# Patient Record
Sex: Male | Born: 1960 | Race: Black or African American | Hispanic: No | Marital: Married | Smoking: Never smoker
Health system: Southern US, Community
[De-identification: ages and names within clinical notes are randomized; demographics above are authoritative.]

## PROBLEM LIST (undated history)

## (undated) DIAGNOSIS — M1990 Unspecified osteoarthritis, unspecified site: Secondary | ICD-10-CM

## (undated) DIAGNOSIS — G4730 Sleep apnea, unspecified: Secondary | ICD-10-CM

## (undated) DIAGNOSIS — R079 Chest pain, unspecified: Principal | ICD-10-CM

## (undated) DIAGNOSIS — I10 Essential (primary) hypertension: Secondary | ICD-10-CM

## (undated) HISTORY — PX: ROTATOR CUFF REPAIR: SHX139

## (undated) HISTORY — DX: Unspecified osteoarthritis, unspecified site: M19.90

## (undated) HISTORY — DX: Chest pain, unspecified: R07.9

## (undated) HISTORY — DX: Sleep apnea, unspecified: G47.30

## (undated) HISTORY — DX: Essential (primary) hypertension: I10

---

## 2002-03-28 ENCOUNTER — Emergency Department (HOSPITAL_COMMUNITY): Admission: EM | Admit: 2002-03-28 | Discharge: 2002-03-28 | Payer: Self-pay | Admitting: Emergency Medicine

## 2009-04-06 ENCOUNTER — Emergency Department (HOSPITAL_COMMUNITY): Admission: EM | Admit: 2009-04-06 | Discharge: 2009-04-06 | Payer: Self-pay | Admitting: Emergency Medicine

## 2009-04-11 ENCOUNTER — Ambulatory Visit (HOSPITAL_COMMUNITY): Admission: RE | Admit: 2009-04-11 | Discharge: 2009-04-11 | Payer: Self-pay | Admitting: Chiropractic Medicine

## 2009-05-02 ENCOUNTER — Ambulatory Visit (HOSPITAL_COMMUNITY): Admission: RE | Admit: 2009-05-02 | Discharge: 2009-05-02 | Payer: Self-pay | Admitting: Chiropractic Medicine

## 2009-10-25 ENCOUNTER — Emergency Department (HOSPITAL_COMMUNITY): Admission: EM | Admit: 2009-10-25 | Discharge: 2009-10-26 | Payer: Self-pay | Admitting: Emergency Medicine

## 2011-03-12 ENCOUNTER — Ambulatory Visit: Payer: Self-pay | Admitting: Internal Medicine

## 2011-03-18 LAB — URINALYSIS, ROUTINE W REFLEX MICROSCOPIC
Nitrite: NEGATIVE
Specific Gravity, Urine: 1.014 (ref 1.005–1.030)
pH: 5.5 (ref 5.0–8.0)

## 2011-03-18 LAB — COMPREHENSIVE METABOLIC PANEL
ALT: 37 U/L (ref 0–53)
Alkaline Phosphatase: 83 U/L (ref 39–117)
Chloride: 104 mEq/L (ref 96–112)
Glucose, Bld: 101 mg/dL — ABNORMAL HIGH (ref 70–99)
Potassium: 4 mEq/L (ref 3.5–5.1)
Sodium: 138 mEq/L (ref 135–145)
Total Protein: 6.9 g/dL (ref 6.0–8.3)

## 2011-03-18 LAB — DIFFERENTIAL
Basophils Relative: 1 % (ref 0–1)
Eosinophils Absolute: 0.2 10*3/uL (ref 0.0–0.7)
Monocytes Absolute: 0.5 10*3/uL (ref 0.1–1.0)
Monocytes Relative: 7 % (ref 3–12)
Neutrophils Relative %: 70 % (ref 43–77)

## 2011-03-18 LAB — CBC
Hemoglobin: 14.7 g/dL (ref 13.0–17.0)
RBC: 4.76 MIL/uL (ref 4.22–5.81)
RDW: 11.4 % — ABNORMAL LOW (ref 11.5–15.5)
WBC: 7.5 10*3/uL (ref 4.0–10.5)

## 2011-04-03 ENCOUNTER — Encounter: Payer: Self-pay | Admitting: Internal Medicine

## 2011-04-03 ENCOUNTER — Ambulatory Visit (INDEPENDENT_AMBULATORY_CARE_PROVIDER_SITE_OTHER): Payer: BC Managed Care – PPO | Admitting: Internal Medicine

## 2011-04-03 VITALS — BP 128/88 | HR 77 | Ht 72.0 in | Wt 170.0 lb

## 2011-04-03 DIAGNOSIS — Z23 Encounter for immunization: Secondary | ICD-10-CM

## 2011-04-03 DIAGNOSIS — Z0000 Encounter for general adult medical examination without abnormal findings: Secondary | ICD-10-CM

## 2011-04-03 LAB — POCT URINALYSIS DIPSTICK
Glucose, UA: NEGATIVE
Nitrite, UA: NEGATIVE
Urobilinogen, UA: 0.2

## 2011-04-03 LAB — LIPID PANEL
Cholesterol: 164 mg/dL (ref 0–200)
HDL: 49.5 mg/dL (ref >39.00)
VLDL: 23.6 mg/dL (ref 0.0–40.0)

## 2011-04-03 LAB — BASIC METABOLIC PANEL
GFR: 81.85 mL/min (ref >60.00)
Potassium: 4.3 mEq/L (ref 3.5–5.1)
Sodium: 139 mEq/L (ref 135–145)

## 2011-04-03 LAB — CBC WITH DIFFERENTIAL/PLATELET
Basophils Absolute: 0 10*3/uL (ref 0.0–0.1)
Eosinophils Absolute: 0.1 10*3/uL (ref 0.0–0.7)
Lymphocytes Relative: 19.4 % (ref 12.0–46.0)
MCHC: 34.5 g/dL (ref 30.0–36.0)
Neutrophils Relative %: 70.3 % (ref 43.0–77.0)
RBC: 5.21 Mil/uL (ref 4.22–5.81)
RDW: 12.2 % (ref 11.5–14.6)

## 2011-04-03 LAB — HEPATIC FUNCTION PANEL
ALT: 48 U/L (ref 0–53)
AST: 41 U/L — ABNORMAL HIGH (ref 0–37)
Bilirubin, Direct: 0.1 mg/dL (ref 0.0–0.3)
Total Protein: 7.5 g/dL (ref 6.0–8.3)

## 2011-04-03 LAB — PSA: PSA: 1.19 ng/mL (ref 0.10–4.00)

## 2011-04-03 NOTE — Progress Notes (Signed)
  Subjective:    Patient ID: Francisco Carter, male    DOB: 21-Apr-1961, 50 y.o.   MRN: [id number]  HPI Patient presents to clinic to establish primary care and for complete physical exam. Notes mild intermittent left leg pain upper and lower without associated back pain, paresthesias, weakness, injury or trauma. 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 Has been told in the past his blood pressure was mildly elevated but has no formal history of hypertension and takes no medication for this. No active complaint.  Reviewed past medical history, past surgical history, medications, allergies,social history, and family history    Review of Systems  Musculoskeletal: Negative for back pain, arthralgias and gait problem.       [Intermittent left leg pain Neurological: Negative for weakness and numbness.  [all other systems reviewed and are negative       Objective:   Physical Exam    Physical Exam  Vitals reviewed. Constitutional:  appears well-developed and well-nourished. No distress.  HENT:  Head: Normocephalic and atraumatic.  Right Ear: Tympanic membrane, external ear and ear canal normal.  Left Ear: Tympanic membrane, external ear and ear canal normal.  Nose: Nose normal.  Mouth/Throat: Oropharynx is clear and moist. No oropharyngeal exudate.  Eyes: Conjunctivae and EOM are normal. Pupils are equal, round, and reactive to light. Right eye exhibits no discharge. Left eye exhibits no discharge. No scleral icterus.  Neck: Neck supple. No thyromegaly present.No carotid bruits Cardiovascular: Normal rate, regular rhythm and normal heart sounds.  Exam reveals no gallop and no friction rub.   No murmur heard. Pulmonary/Chest: Effort normal and breath sounds normal. No respiratory distress.  has no wheezes.  has no rales.  Abdomen; Soft nondistended nontender positive bowel sounds. No masses appreciated.No  organomegaly Extremities: No edema.Acyanotic. Lymphadenopathy:   no cervical adenopathy.  Neurological:  is alert.  Skin: Skin is warm and dry.  not diaphoretic.  Rectal:G ood sphincter tone. Minimal stool in vault heme-negative Prostate soft nontender without nodularity [id number] descended testes without nodularity or tenderness. No hernia on Valsalva. Psychiatric: normal mood and affect.      Assessment & Plan:

## 2011-04-04 DIAGNOSIS — Z0000 Encounter for general adult medical examination without abnormal findings: Secondary | ICD-10-CM | POA: Insufficient documentation

## 2011-04-04 NOTE — Assessment & Plan Note (Signed)
Discussed history of mild elevated blood pressure and recommended low sodium diet and regular aerobic exercise. Monitor blood pressure as an outpatient and followup in clinic as scheduled. Obtain CBC, Chem-7, LFT, fasting lipid profile, PSA and urinalysis. EKG obtained in straight normal sinus rhythm, normal intervals and axis without evidence of ischemia. Recommend screening colonoscopy at age 50.

## 2011-04-10 ENCOUNTER — Telehealth: Payer: Self-pay

## 2011-04-10 NOTE — Telephone Encounter (Signed)
Message copied by Kyung Rudd on Fri Apr 10, 2011  2:00 PM ------      Message from: Letitia Libra, [place]      Created: Fri Apr 10, 2011 12:41 PM       Labs nl except minimally elevated liver test. Suggest avoiding tylenol and etoh for a few weeks. Drink enough water. Recheck LFT in 3-4 wks. Dx=abn LFT

## 2011-04-10 NOTE — Telephone Encounter (Signed)
Pt aware. Pt to call for lab appt in 3-4 weeks

## 2011-05-08 ENCOUNTER — Other Ambulatory Visit (INDEPENDENT_AMBULATORY_CARE_PROVIDER_SITE_OTHER): Payer: BC Managed Care – PPO

## 2011-05-08 DIAGNOSIS — T887XXA Unspecified adverse effect of drug or medicament, initial encounter: Secondary | ICD-10-CM

## 2011-05-08 LAB — HEPATIC FUNCTION PANEL
ALT: 36 U/L (ref 0–53)
Albumin: 3.9 g/dL (ref 3.5–5.2)
Total Bilirubin: 0.8 mg/dL (ref 0.3–1.2)

## 2011-05-13 ENCOUNTER — Telehealth: Payer: Self-pay

## 2011-05-13 NOTE — Telephone Encounter (Signed)
Left message to notify pt test nl

## 2011-05-13 NOTE — Telephone Encounter (Signed)
Message copied by Beverely Low on Wed May 13, 2011  2:43 PM ------      Message from: Staci Righter      Created: Tue May 12, 2011 10:21 PM       Repeat liver tests nl

## 2011-10-02 ENCOUNTER — Encounter: Payer: Self-pay | Admitting: Internal Medicine

## 2011-10-02 ENCOUNTER — Ambulatory Visit (INDEPENDENT_AMBULATORY_CARE_PROVIDER_SITE_OTHER): Payer: BC Managed Care – PPO | Admitting: Internal Medicine

## 2011-10-02 ENCOUNTER — Ambulatory Visit: Payer: BC Managed Care – PPO | Admitting: Internal Medicine

## 2011-10-02 VITALS — BP 132/90 | HR 72 | Temp 97.8°F | Resp 18

## 2011-10-02 DIAGNOSIS — Z23 Encounter for immunization: Secondary | ICD-10-CM

## 2011-10-02 DIAGNOSIS — I10 Essential (primary) hypertension: Secondary | ICD-10-CM

## 2011-10-02 DIAGNOSIS — G5600 Carpal tunnel syndrome, unspecified upper limb: Secondary | ICD-10-CM

## 2011-10-02 NOTE — Assessment & Plan Note (Signed)
Mild and occurs infrequently. If becomes more frequent recommend wrist splint. Followup if no improvement or worsening.

## 2011-10-02 NOTE — Assessment & Plan Note (Signed)
New formal dx. Mild and asx. Recommend low sodium diet and regular exercise. Monitor bp as outpt and follow up in clinic as scheduled.

## 2011-10-02 NOTE — Progress Notes (Signed)
  Subjective:    Patient ID: Francisco Carter, male    DOB: 13-Oct-1961, 50 y.o.   MRN: [id number]  HPI Pt presents to clinic for follow up of elevated blood pressure. Home monitoring has demonstrated continued mild elevations with several diastolic values 90+. Asx without headaches, dizziness, chest pain or dyspnea. Not taking any medication for the problem. Also notes intermittent R>L hand pain and numbness. Occurs ~ 1 time a month. No injury or trauma. Works as a Naval architect. No other alleviating or exacerbating factors. No other complaints.  No past medical history on file. Past Surgical History  Procedure Date  . Rotator cuff repair     reports that he has never smoked. He has never used smokeless tobacco. He reports that he drinks alcohol. He reports that he does not use illicit drugs. family history includes Cancer in his mother; Heart disease in his father; and Hypertension in his father and mother. No Known Allergies     Review of Systems see hpi     Objective:   Physical Exam  Nursing note and vitals reviewed. Constitutional: He appears well-developed and well-nourished. No distress.  HENT:  Head: Normocephalic and atraumatic.  Right Ear: External ear normal.  Left Ear: External ear normal.  Eyes: Conjunctivae are normal. No scleral icterus.  Cardiovascular: Normal rate, regular rhythm and normal heart sounds.  Exam reveals no gallop and no friction rub.   No murmur heard. Pulmonary/Chest: Effort normal and breath sounds normal. No respiratory distress. He has no wheezes. He has no rales.  Musculoskeletal:       phalen's right neg. No thenar muscle wasting  Neurological: He is alert.  Skin: Skin is warm and dry. He is not diaphoretic.  Psychiatric: He has a normal mood and affect.          Assessment & Plan:

## 2012-01-01 ENCOUNTER — Ambulatory Visit (INDEPENDENT_AMBULATORY_CARE_PROVIDER_SITE_OTHER): Payer: BC Managed Care – PPO | Admitting: Internal Medicine

## 2012-01-01 ENCOUNTER — Ambulatory Visit (HOSPITAL_BASED_OUTPATIENT_CLINIC_OR_DEPARTMENT_OTHER)
Admission: RE | Admit: 2012-01-01 | Discharge: 2012-01-01 | Disposition: A | Discharge status: Home / Self Care | Payer: BC Managed Care – PPO | Source: Ambulatory Visit | Attending: Internal Medicine | Admitting: Internal Medicine

## 2012-01-01 ENCOUNTER — Encounter: Payer: Self-pay | Admitting: Internal Medicine

## 2012-01-01 DIAGNOSIS — M5090 Cervical disc disorder, unspecified, unspecified cervical region: Secondary | ICD-10-CM | POA: Insufficient documentation

## 2012-01-01 DIAGNOSIS — M25519 Pain in unspecified shoulder: Secondary | ICD-10-CM | POA: Insufficient documentation

## 2012-01-01 DIAGNOSIS — I10 Essential (primary) hypertension: Secondary | ICD-10-CM

## 2012-01-01 DIAGNOSIS — M542 Cervicalgia: Secondary | ICD-10-CM

## 2012-01-01 DIAGNOSIS — Z1211 Encounter for screening for malignant neoplasm of colon: Secondary | ICD-10-CM

## 2012-01-01 DIAGNOSIS — Q761 Klippel-Feil syndrome: Secondary | ICD-10-CM

## 2012-01-01 MED ORDER — METHYLPREDNISOLONE 4 MG PO KIT: PACK | ORAL | Status: DC

## 2012-01-01 NOTE — Progress Notes (Signed)
  Subjective:    Patient ID: Francisco Carter, male    DOB: Apr 13, 1961, 51 y.o.   MRN: [id number]  HPI Pt presents to clinic for followup of multiple medical problems. Recent dx of mild HTN. Home bp log reviewed with values of 118/83, 126/84 and 119/90. Taking no medication for the problem. Notes right arm radiating pain from neck to hand intermittently. No weakness or injury. Does have intermittent R>L hand numbness. No other alleviating or exacerbating factors. Now is 51y old and has not undergone colonoscopy previously. asx and no fam hx of colon cancer. No other complaints.   No past medical history on file. Past Surgical History  Procedure Date  . Rotator cuff repair     reports that he has never smoked. He has never used smokeless tobacco. He reports that he drinks alcohol. He reports that he does not use illicit drugs. family history includes Cancer in his mother; Heart disease in his father; and Hypertension in his father and mother. No Known Allergies   Review of Systems see hpi     Objective:   Physical Exam  Nursing note and vitals reviewed. Constitutional: He appears well-developed and well-nourished. No distress.  HENT:  Head: Normocephalic and atraumatic.  Right Ear: External ear normal.  Left Ear: External ear normal.  Eyes: Conjunctivae are normal. No scleral icterus.  Neck: Neck supple.  Cardiovascular: Normal rate, regular rhythm and normal heart sounds.  Exam reveals no gallop and no friction rub.   No murmur heard. Musculoskeletal:       Neck FROM. Left distal mcp/wrist 5/5.  Skin: Skin is warm and dry. He is not diaphoretic.  Psychiatric: He has a normal mood and affect.          Assessment & Plan:

## 2012-01-01 NOTE — Assessment & Plan Note (Signed)
Colonoscopy referral

## 2012-01-01 NOTE — Assessment & Plan Note (Signed)
Mild. Asx. Recommend attempt low sodium diet and regular aerobic exercise at least 4-5/wk. Continue home monitoring.

## 2012-01-01 NOTE — Assessment & Plan Note (Signed)
Radicular component. No weakness. Obtain cspine radiograph and attempt medrol dosepak. Followup if no improvement or worsening.

## 2012-01-04 ENCOUNTER — Telehealth: Payer: Self-pay | Admitting: *Deleted

## 2012-01-04 MED ORDER — METHYLPREDNISOLONE 4 MG PO KIT: PACK | ORAL | Status: AC

## 2012-01-04 NOTE — Telephone Encounter (Signed)
Patient called and left voice message regarding rx that was sent to pharmacy.  I spoke with Zella Ball at [facility] on [address], she stated [facility] and they do not have a Rx for him.  Call returned to patient at [phone], no answer. A voice message was left for patient to return call to clarify pharmacy.  Patient returned phone call; he has requested the Rx be sent to [facility] at [address]. Rx resent to pharmacy requested by patient.

## 2012-02-08 ENCOUNTER — Other Ambulatory Visit: Payer: BC Managed Care – PPO | Admitting: Internal Medicine

## 2012-02-19 ENCOUNTER — Telehealth: Payer: Self-pay | Admitting: *Deleted

## 2012-02-19 NOTE — Telephone Encounter (Signed)
RSC pt for 02/25/12.  Pt. Forgot his appt.

## 2012-02-25 ENCOUNTER — Ambulatory Visit (AMBULATORY_SURGERY_CENTER): Payer: BC Managed Care – PPO | Admitting: *Deleted

## 2012-02-25 ENCOUNTER — Encounter: Payer: Self-pay | Admitting: Gastroenterology

## 2012-02-25 VITALS — Ht 72.0 in | Wt 188.1 lb

## 2012-02-25 DIAGNOSIS — Z1211 Encounter for screening for malignant neoplasm of colon: Secondary | ICD-10-CM

## 2012-02-25 MED ORDER — PEG-KCL-NACL-NASULF-NA ASC-C 100 G PO SOLR: ORAL | Status: DC

## 2012-02-25 NOTE — Progress Notes (Signed)
No allergy to eggs or soy products 

## 2012-03-04 ENCOUNTER — Ambulatory Visit (AMBULATORY_SURGERY_CENTER): Payer: BC Managed Care – PPO | Admitting: Gastroenterology

## 2012-03-04 ENCOUNTER — Encounter: Payer: Self-pay | Admitting: Gastroenterology

## 2012-03-04 VITALS — BP 142/85 | HR 72 | Temp 97.7°F | Resp 20 | Ht 72.0 in | Wt 188.0 lb

## 2012-03-04 DIAGNOSIS — K648 Other hemorrhoids: Secondary | ICD-10-CM

## 2012-03-04 DIAGNOSIS — Z1211 Encounter for screening for malignant neoplasm of colon: Secondary | ICD-10-CM

## 2012-03-04 MED ORDER — SODIUM CHLORIDE 0.9 % IV SOLN: 500.0000 mL | INTRAVENOUS | Status: DC

## 2012-03-04 NOTE — Progress Notes (Signed)
Patient did not experience any of the following events: a burn prior to discharge; a fall within the facility; wrong site/side/patient/procedure/implant event; or a hospital transfer or hospital admission upon discharge from the facility. [phone]) Patient did not have preoperative order for IV antibiotic SSI prophylaxis. [phone])  Patient states he feel fine. Encourage when moving around today will expel air. Take mylanta or gas x later if needeed.

## 2012-03-04 NOTE — Progress Notes (Signed)
Propofol given per D Merritt CRNA 

## 2012-03-04 NOTE — Patient Instructions (Signed)
Discharge instructions given with verbal understanding. Handouts on diverticulosis and hemorrhoids given. Resume previous medications. YOU HAD AN ENDOSCOPIC PROCEDURE TODAY AT THE [facility]: Refer to the procedure report that was given to you for any specific questions about what was found during the examination.  If the procedure report does not answer your questions, please call your gastroenterologist to clarify.  If you requested that your care partner not be given the details of your procedure findings, then the procedure report has been included in a sealed envelope for you to review at your convenience later.  YOU SHOULD EXPECT: Some feelings of bloating in the abdomen. Passage of more gas than usual.  Walking can help get rid of the air that was put into your GI tract during the procedure and reduce the bloating. If you had a lower endoscopy (such as a colonoscopy or flexible sigmoidoscopy) you may notice spotting of blood in your stool or on the toilet paper. If you underwent a bowel prep for your procedure, then you may not have a normal bowel movement for a few days.  DIET: Your first meal following the procedure should be a light meal and then it is ok to progress to your normal diet.  A half-sandwich or bowl of soup is an example of a good first meal.  Heavy or fried foods are harder to digest and may make you feel nauseous or bloated.  Likewise meals heavy in dairy and vegetables can cause extra gas to form and this can also increase the bloating.  Drink plenty of fluids but you should avoid alcoholic beverages for 24 hours.  ACTIVITY: Your care partner should take you home directly after the procedure.  You should plan to take it easy, moving slowly for the rest of the day.  You can resume normal activity the day after the procedure however you should NOT DRIVE or use heavy machinery for 24 hours (because of the sedation medicines used during the test).    SYMPTOMS TO REPORT  IMMEDIATELY: A gastroenterologist can be reached at any hour.  During normal business hours, 8:30 AM to 5:00 PM Monday through Friday, call [phone].  After hours and on weekends, please call the GI answering service at [phone] who will take a message and have the physician on call contact you.   Following lower endoscopy (colonoscopy or flexible sigmoidoscopy):  Excessive amounts of blood in the stool  Significant tenderness or worsening of abdominal pains  Swelling of the abdomen that is new, acute  Fever of 100F or higher  FOLLOW UP: If any biopsies were taken you will be contacted by phone or by letter within the next 1-3 weeks.  Call your gastroenterologist if you have not heard about the biopsies in 3 weeks.  Our staff will call the home number listed on your records the next business day following your procedure to check on you and address any questions or concerns that you may have at that time regarding the information given to you following your procedure. This is a courtesy call and so if there is no answer at the home number and we have not heard from you through the emergency physician on call, we will assume that you have returned to your regular daily activities without incident.  SIGNATURES/CONFIDENTIALITY: You and/or your care partner have signed paperwork which will be entered into your electronic medical record.  These signatures attest to the fact that that the information above on your After Visit   Summary has been reviewed and is understood.  Full responsibility of the confidentiality of this discharge information lies with you and/or your care-partner. 

## 2012-03-04 NOTE — Op Note (Signed)
[facility] [address] [place], [place]  [address]  COLONOSCOPY PROCEDURE REPORT  PATIENT:  Francisco, Carter  MR#:  [id number] BIRTHDATE:  [birth year], 50 yrs. old  GENDER:  male ENDOSCOPIST:  Barbette Hair. Arlyce Dice, MD REF. BY:  Charlynn Court, M.D. PROCEDURE DATE:  03/04/2012 PROCEDURE:  Diagnostic Colonoscopy ASA CLASS:  Class I INDICATIONS:  Routine Risk Screening MEDICATIONS:   MAC sedation, administered by CRNA propofol 250mg IV  DESCRIPTION OF PROCEDURE:   After the risks benefits and alternatives of the procedure were thoroughly explained, informed consent was obtained.  Digital rectal exam was performed and revealed no abnormalities.   The [id number] endoscope was introduced through the anus and advanced to the cecum, which was identified by both the appendix and ileocecal valve, without limitations.  The quality of the prep was good, using MoviPrep. The instrument was then slowly withdrawn as the colon was fully examined. <<PROCEDUREIMAGES>>  FINDINGS:  Internal Hemorrhoids were found (see image4). Scattered diverticula were found (see image1). Sigmoid to transverse colon  Scattered diverticula were found (see image2). This was otherwise a normal examination of the colon (see image3). Retroflexed views in the rectum revealed no abnormalities.    The time to cecum =  1) 3.0  minutes. The scope was then withdrawn in 1) 9.75  minutes from the cecum and the procedure completed. COMPLICATIONS:  None ENDOSCOPIC IMPRESSION: 1) Diverticula, scattered 2) Internal hemorrhoids 3) Otherwise normal examination RECOMMENDATIONS: 1) Continue current colorectal screening recommendations for "routine risk" patients with a repeat colonoscopy in 10 years. REPEAT EXAM:  In 10 year(s) for Colonoscopy.  ______________________________ Barbette Hair. Arlyce Dice, MD  CC:  n. eSIGNED:   Barbette Hair. Abrish Erny at 03/04/2012 10:26 AM  Tollie Eth, [phone]

## 2012-03-07 ENCOUNTER — Telehealth: Payer: Self-pay | Admitting: *Deleted

## 2012-03-07 NOTE — Telephone Encounter (Signed)
  Follow up Call-  Call back number 03/04/2012  Post procedure Call Back phone  # [phone]  Permission to leave phone message Yes     Patient questions:  Message left to call if necessary.

## 2012-04-01 ENCOUNTER — Encounter: Payer: Self-pay | Admitting: Internal Medicine

## 2012-04-01 ENCOUNTER — Ambulatory Visit (INDEPENDENT_AMBULATORY_CARE_PROVIDER_SITE_OTHER): Payer: BC Managed Care – PPO | Admitting: Internal Medicine

## 2012-04-01 VITALS — BP 122/80 | HR 72 | Temp 98.2°F | Ht 72.0 in | Wt 183.0 lb

## 2012-04-01 DIAGNOSIS — K5790 Diverticulosis of intestine, part unspecified, without perforation or abscess without bleeding: Secondary | ICD-10-CM | POA: Insufficient documentation

## 2012-04-01 DIAGNOSIS — K5730 Diverticulosis of large intestine without perforation or abscess without bleeding: Secondary | ICD-10-CM

## 2012-04-01 DIAGNOSIS — K648 Other hemorrhoids: Secondary | ICD-10-CM

## 2012-04-01 NOTE — Assessment & Plan Note (Signed)
Asx. Discussed potential sx's of ext hemorrhoid.

## 2012-04-01 NOTE — Assessment & Plan Note (Signed)
Asx. Discussed pathophysiology and potential for diverticulitis

## 2012-04-01 NOTE — Progress Notes (Signed)
  Subjective:    Patient ID: Francisco Carter, male    DOB: Nov 15, 1961, 51 y.o.   MRN: [id number]  HPI Pt presents to clinic for followup of multiple medical problems. Reviewed recent colonoscopy with findings of IH and diverticula. No rectal bleeding. No recent shoulder/arm pain or arm/hand paresthesias. Noted brief few days duration last week of nausea now resolved. Son had stomach bug prior to that. No other complaints.  Past Medical History  Diagnosis Date  . Arthritis    Past Surgical History  Procedure Date  . Rotator cuff repair     left    reports that he has never smoked. He has never used smokeless tobacco. He reports that he drinks alcohol. He reports that he does not use illicit drugs. family history includes Cancer in his mother; Heart disease in his father; and Hypertension in his father and mother.  There is no history of Colon cancer, and Esophageal cancer, and Rectal cancer, and Prostate cancer, . No Known Allergies    Review of Systems see hpi     Objective:   Physical Exam  Physical Exam  Nursing note and vitals reviewed. Constitutional: Appears well-developed and well-nourished. No distress.  HENT:  Head: Normocephalic and atraumatic.  Right Ear: External ear normal.  Left Ear: External ear normal.  Eyes: Conjunctivae are normal. No scleral icterus.  Neck: Neck supple. Carotid bruit is not present.  Cardiovascular: Normal rate, regular rhythm and normal heart sounds.  Exam reveals no gallop and no friction rub.   No murmur heard. Pulmonary/Chest: Effort normal and breath sounds normal. No respiratory distress. He has no wheezes. no rales.  Lymphadenopathy:    He has no cervical adenopathy.  Neurological:Alert.  Skin: Skin is warm and dry. Not diaphoretic.  Psychiatric: Has a normal mood and affect.        Assessment & Plan:

## 2012-04-01 NOTE — Patient Instructions (Signed)
Please schedule fasting labs prior to your physical Cbc, chem7, lft, lipid, tsh, psa, urinalysis v70.0

## 2012-04-17 IMAGING — CR DG CERVICAL SPINE COMPLETE 4+V
6 series · 6 of 6 positions shown · non-contrast
Comparison: Prior study 04/06/2009.

CLINICAL DATA: Neck and radiating right shoulder pain.

CERVICAL SPINE - COMPLETE 4+ VIEW

[w c-spine lat]
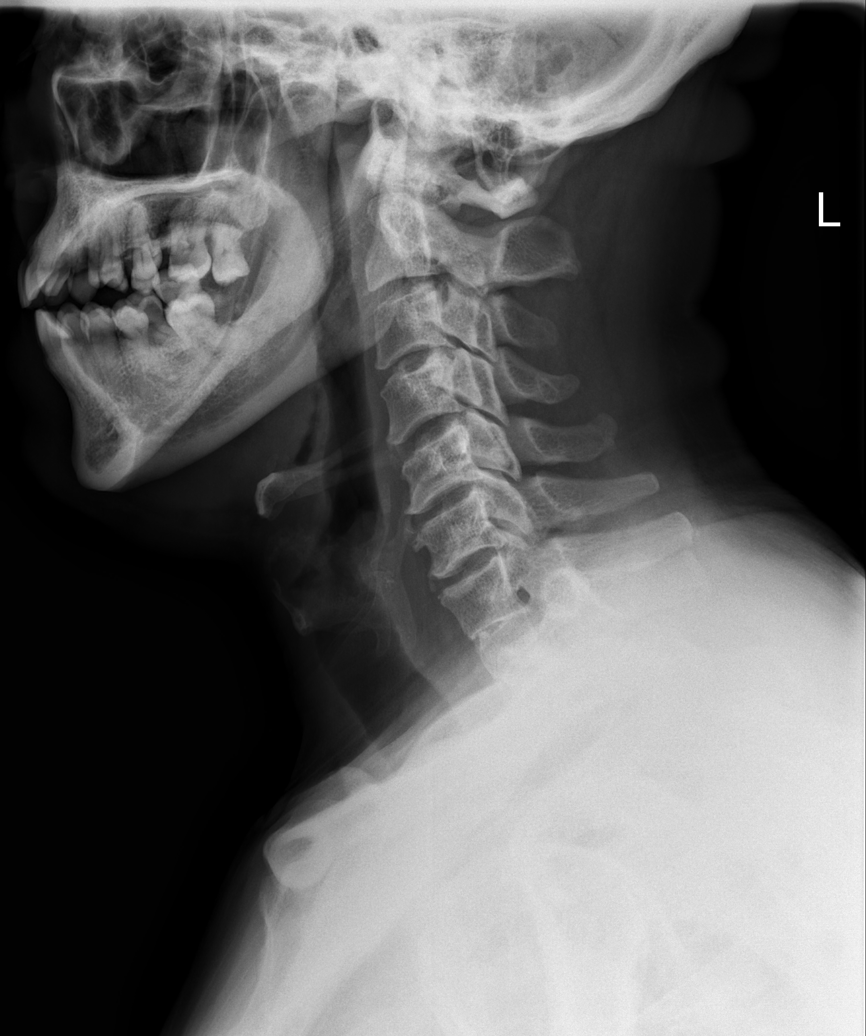

[w c-spine oblique (1 of 2)]
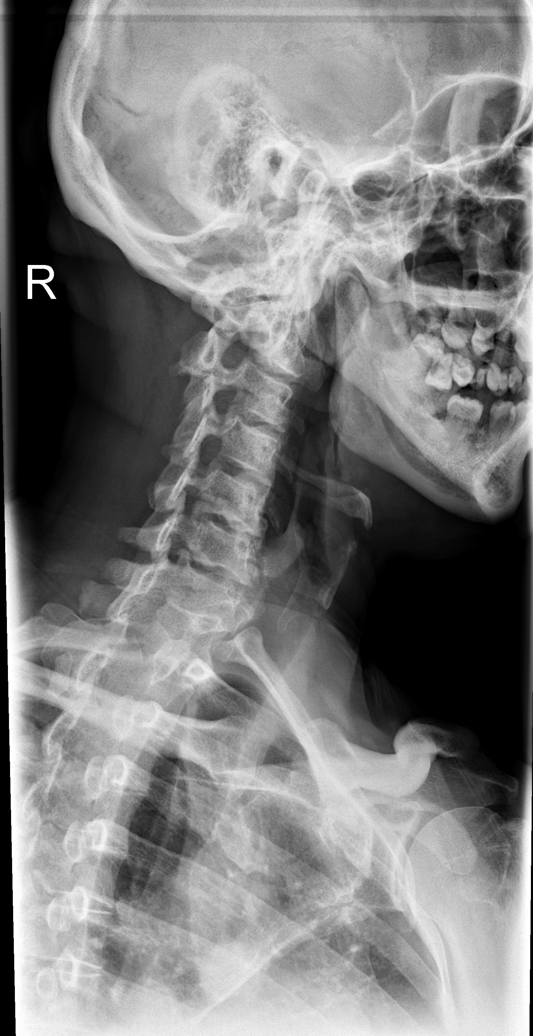

[w c-spine oblique (2 of 2)]
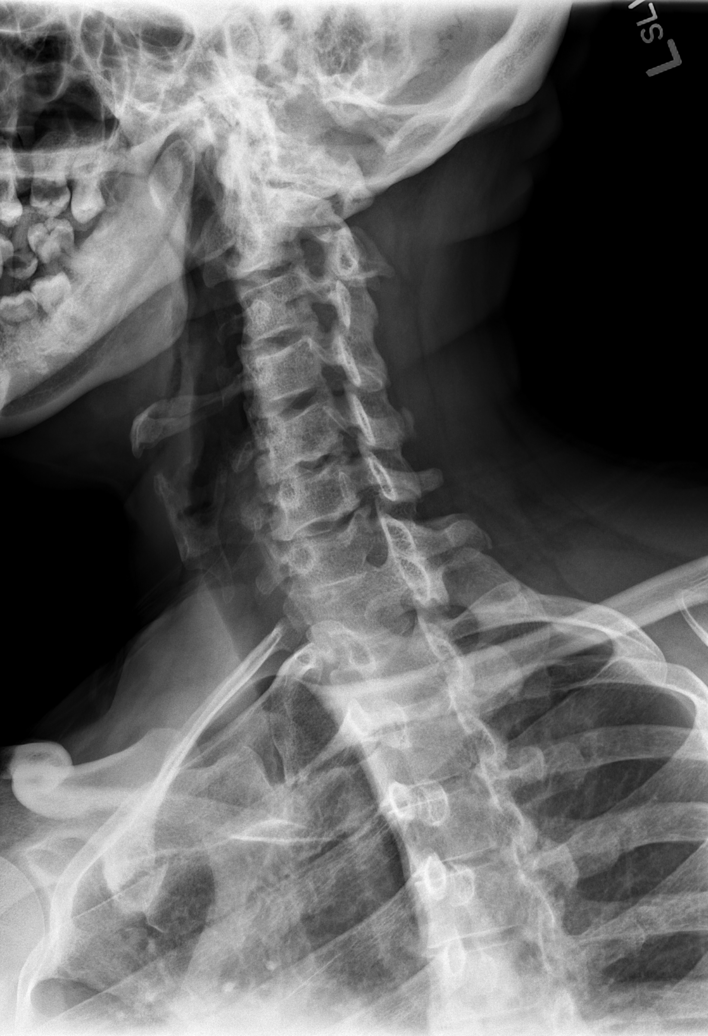

[w c-spine a.p.]
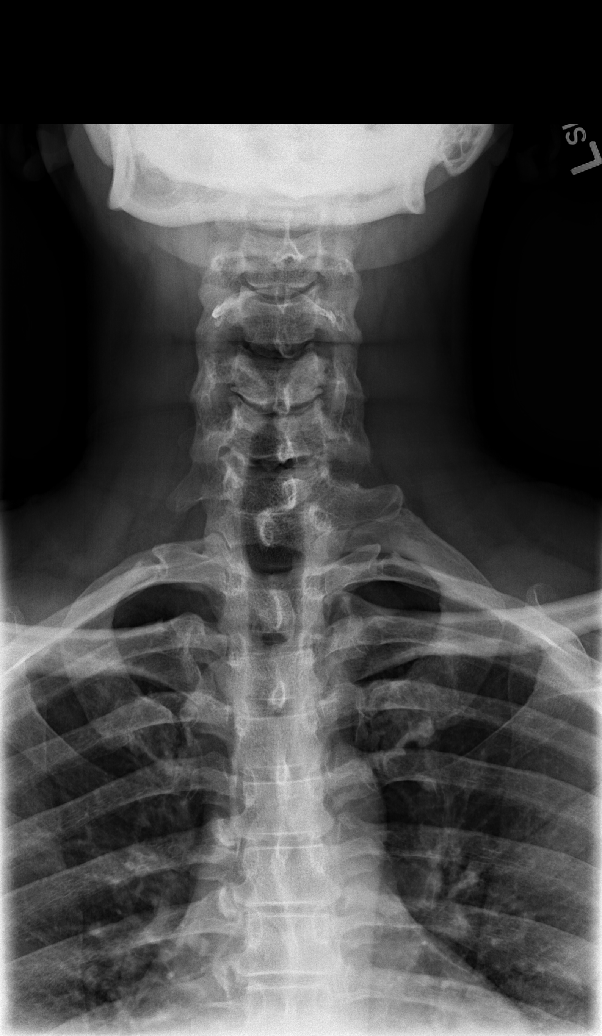

[w c-spine odontoid (1 of 2)]
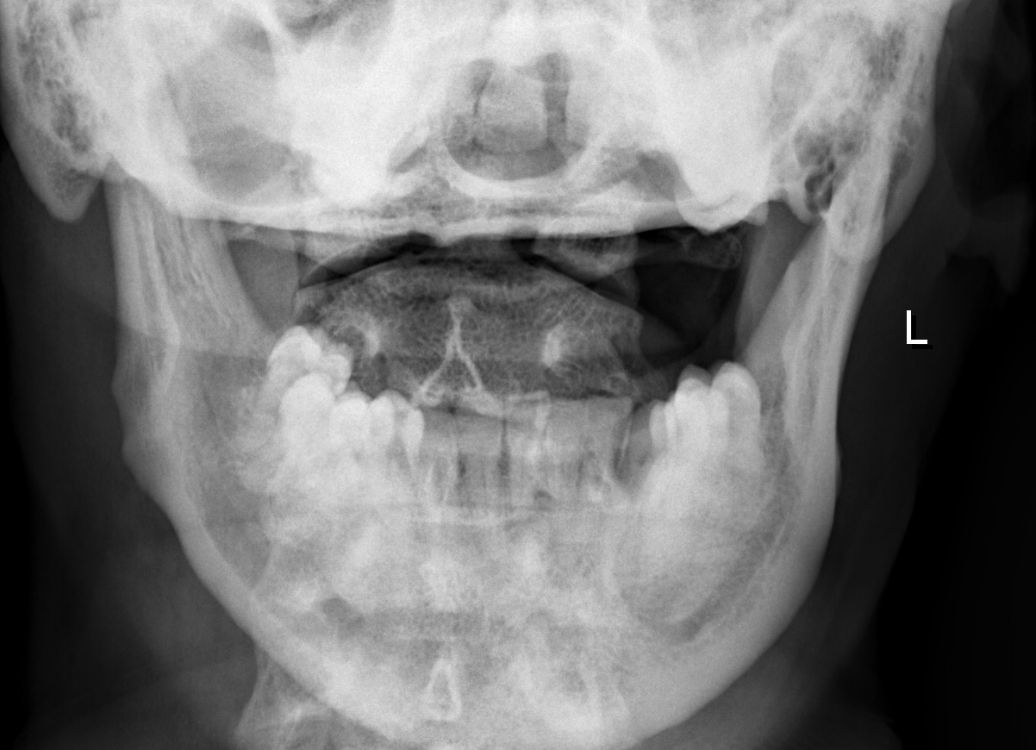

[w c-spine odontoid (2 of 2)]
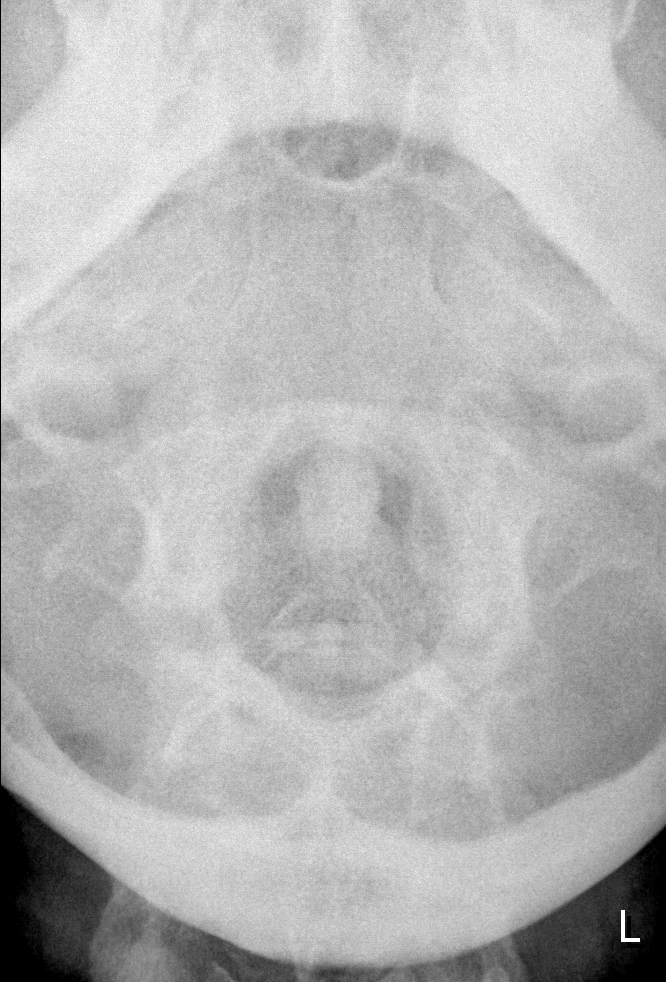

[6 of 6 positions shown; findings below may reference images not displayed]

FINDINGS: The lateral film demonstrates normal alignment of the
cervical vertebral bodies.  Stable degenerative changes with disc
disease and facet disease most notable at C5-6 and C6-7.  Mild bony
foraminal narrowing at both these levels due to uncinate spurring
changes.  No acute bony findings or abnormal prevertebral soft
tissue swelling.  Zeinab anomaly at C7-T1.  Associated
asymmetry of the first ribs is noted.  The C1-2 articulations are
maintained.  Lung apices are clear.
IMPRESSION: 1.  Zeinab anomaly at C7-T1.
2.  No acute bony findings.
3.  Mild foraminal narrowing bilaterally at C5-6 and C6-7 due to
uncinate spurring.

## 2012-05-06 ENCOUNTER — Encounter: Payer: Self-pay | Admitting: Internal Medicine

## 2012-05-06 ENCOUNTER — Ambulatory Visit (INDEPENDENT_AMBULATORY_CARE_PROVIDER_SITE_OTHER): Payer: BC Managed Care – PPO | Admitting: Internal Medicine

## 2012-05-06 ENCOUNTER — Telehealth: Payer: Self-pay | Admitting: Internal Medicine

## 2012-05-06 VITALS — BP 110/90 | HR 73 | Temp 98.0°F | Resp 16 | Ht 72.0 in | Wt 183.0 lb

## 2012-05-06 DIAGNOSIS — Z0000 Encounter for general adult medical examination without abnormal findings: Secondary | ICD-10-CM

## 2012-05-06 DIAGNOSIS — Z125 Encounter for screening for malignant neoplasm of prostate: Secondary | ICD-10-CM

## 2012-05-06 LAB — BASIC METABOLIC PANEL
CO2: 28 mEq/L (ref 19–32)
Calcium: 9.4 mg/dL (ref 8.4–10.5)
Sodium: 141 mEq/L (ref 135–145)

## 2012-05-06 LAB — LIPID PANEL
Cholesterol: 142 mg/dL (ref 0–200)
HDL: 43 mg/dL (ref >39)
Total CHOL/HDL Ratio: 3.3 Ratio
Triglycerides: 62 mg/dL (ref <150)

## 2012-05-06 LAB — HEPATIC FUNCTION PANEL
ALT: 21 U/L (ref 0–53)
AST: 22 U/L (ref 0–37)
Albumin: 4.4 g/dL (ref 3.5–5.2)
Total Bilirubin: 0.8 mg/dL (ref 0.3–1.2)
Total Protein: 6.8 g/dL (ref 6.0–8.3)

## 2012-05-06 LAB — CBC WITH DIFFERENTIAL/PLATELET
Eosinophils Absolute: 0.1 10*3/uL (ref 0.0–0.7)
Lymphs Abs: 1.4 10*3/uL (ref 0.7–4.0)
MCH: 29.7 pg (ref 26.0–34.0)
Neutrophils Relative %: 65 % (ref 43–77)
Platelets: 250 10*3/uL (ref 150–400)
RBC: 5.02 MIL/uL (ref 4.22–5.81)
WBC: 5.3 10*3/uL (ref 4.0–10.5)

## 2012-05-06 LAB — TSH: TSH: 1.538 u[IU]/mL (ref 0.350–4.500)

## 2012-05-06 NOTE — Telephone Encounter (Signed)
Lab order entered for May 2014.

## 2012-05-06 NOTE — Patient Instructions (Signed)
Please schedule fasting labs prior to your next physical Cbc, chem7, lipid, lft, tsh, ua and psa v70.0

## 2012-05-07 LAB — URINALYSIS, ROUTINE W REFLEX MICROSCOPIC
Bilirubin Urine: NEGATIVE
Hgb urine dipstick: NEGATIVE
Ketones, ur: NEGATIVE mg/dL
Leukocytes, UA: NEGATIVE
Protein, ur: NEGATIVE mg/dL
Urobilinogen, UA: 1 mg/dL (ref 0.0–1.0)

## 2012-05-11 NOTE — Progress Notes (Signed)
  Subjective:    Patient ID: Francisco Carter, male    DOB: 06-21-1961, 51 y.o.   MRN: [id number]  HPI Pt presents to clinic for annual exam. Colonoscopy recently performed and results reviewed. DRE done prior to procedure. Has chronic intermittent left knee pain without recent injury.   Past Medical History  Diagnosis Date  . Arthritis    Past Surgical History  Procedure Date  . Rotator cuff repair     left    reports that he has never smoked. He has never used smokeless tobacco. He reports that he drinks alcohol. He reports that he does not use illicit drugs. family history includes Cancer in his mother; Heart disease in his father; and Hypertension in his father and mother.  There is no history of Colon cancer, and Esophageal cancer, and Rectal cancer, and Prostate cancer, . No Known Allergies   Review of Systems see hpi     Objective:   Physical Exam  Nursing note and vitals reviewed. Constitutional: He appears well-developed and well-nourished. No distress.  HENT:  Head: Normocephalic and atraumatic.  Right Ear: External ear normal.  Left Ear: External ear normal.  Nose: Nose normal.  Mouth/Throat: Oropharynx is clear and moist. No oropharyngeal exudate.  Eyes: Conjunctivae and EOM are normal. Pupils are equal, round, and reactive to light. Right eye exhibits no discharge. Left eye exhibits no discharge. No scleral icterus.  Neck: Neck supple. No thyromegaly present.  Cardiovascular: Normal rate, regular rhythm, normal heart sounds and intact distal pulses.  Exam reveals no gallop and no friction rub.   No murmur heard. Pulmonary/Chest: Effort normal and breath sounds normal. No respiratory distress. He has no wheezes. He has no rales.  Abdominal: Soft. Bowel sounds are normal. He exhibits no distension and no mass. There is no tenderness. There is no rebound and no guarding.  Lymphadenopathy:    He has no cervical adenopathy.  Neurological: He is alert.  Skin: Skin is  warm and dry. No rash noted. He is not diaphoretic.  Psychiatric: He has a normal mood and affect.          Assessment & Plan:

## 2012-05-11 NOTE — Assessment & Plan Note (Signed)
Nl exam. Obtain cpe labs. ekg obtained showing nsr 63 with nl intervals and axis. Consider orthopedic evaluation if knee pain persists.

## 2012-05-18 ENCOUNTER — Telehealth: Payer: Self-pay | Admitting: Internal Medicine

## 2012-05-18 NOTE — Telephone Encounter (Signed)
Patient is requesting last lab results 

## 2012-05-19 NOTE — Telephone Encounter (Signed)
Call placed to patient at [phone], he was informed of lab results from 05/06/2012 per Dr Rodena Medin instructions, and has verbalized understanding.

## 2013-04-05 ENCOUNTER — Encounter: Payer: Self-pay | Admitting: Family

## 2013-04-05 ENCOUNTER — Ambulatory Visit (INDEPENDENT_AMBULATORY_CARE_PROVIDER_SITE_OTHER): Payer: BC Managed Care – PPO | Admitting: Family

## 2013-04-05 VITALS — BP 114/86 | HR 58 | Temp 98.0°F | Resp 16 | Ht 72.0 in | Wt 176.0 lb

## 2013-04-05 DIAGNOSIS — R0989 Other specified symptoms and signs involving the circulatory and respiratory systems: Secondary | ICD-10-CM

## 2013-04-05 DIAGNOSIS — G4733 Obstructive sleep apnea (adult) (pediatric): Secondary | ICD-10-CM | POA: Insufficient documentation

## 2013-04-05 DIAGNOSIS — R0683 Snoring: Secondary | ICD-10-CM

## 2013-04-05 NOTE — Patient Instructions (Addendum)
You will be contacted about your home sleep study. Please let us know if you have not heard back about this in 1 week. Do not drive when sleepy. Follow up with [place] in 2 months.

## 2013-04-05 NOTE — Progress Notes (Signed)
  Subjective:    Patient ID: Francisco Carter, male    DOB: 1961-10-18, 52 y.o.   MRN: [id number]  HPI  [name] is a 52 yr old male who presents today to discuss possible sleep apnea.  He reports + hx of loud snoring and his fiance notes that he breathing stops when he is laying flat on his back.      Review of Systems See HPI  Past Medical History  Diagnosis Date  . Arthritis     History   Social History  . Marital Status: Single    Spouse Name: N/A    Number of Children: N/A  . Years of Education: N/A   Occupational History  . Not on file.   Social History Main Topics  . Smoking status: Never Smoker   . Smokeless tobacco: Never Used  . Alcohol Use: Yes     Comment: occationally  . Drug Use: No  . Sexually Active: Not on file   Other Topics Concern  . Not on file   Social History Narrative  . No narrative on file    Past Surgical History  Procedure Laterality Date  . Rotator cuff repair      left    Family History  Problem Relation Age of Onset  . Cancer Mother     bone  . Hypertension Mother   . Heart disease Father   . Hypertension Father   . Colon cancer Neg Hx   . Esophageal cancer Neg Hx   . Rectal cancer Neg Hx   . Prostate cancer Neg Hx     No Known Allergies  No current outpatient prescriptions on file prior to visit.   No current facility-administered medications on file prior to visit.    BP 114/86  Pulse 58  Temp(Src) 98 F (36.7 C) (Oral)  Resp 16  Ht 6' (1.829 m)  Wt 176 lb 0.1 oz (79.837 kg)  BMI 23.87 kg/m2  SpO2 99%       Objective:   Physical Exam  Constitutional: He is oriented to person, place, and time. He appears well-developed and well-nourished. No distress.  HENT:  Head: Normocephalic and atraumatic.  Narrow oropharynx.  Cardiovascular: Normal rate and regular rhythm.   No murmur heard. Pulmonary/Chest: Effort normal and breath sounds normal. No respiratory distress. He has no wheezes. He has no  rales. He exhibits no tenderness.  Lymphadenopathy:    He has no cervical adenopathy.  Neurological: He is alert and oriented to person, place, and time.  Skin: Skin is warm and dry.  Psychiatric: He has a normal mood and affect. His behavior is normal. Judgment and thought content normal.          Assessment & Plan:

## 2013-04-05 NOTE — Assessment & Plan Note (Signed)
Suspect OSA.  Will refer for home sleep study.  Epsworth sleepiness scale completed today and pt scored 12 out of 24.  Borderline abnormal.  He thinks that his dad had OSA.

## 2013-04-06 ENCOUNTER — Emergency Department (HOSPITAL_BASED_OUTPATIENT_CLINIC_OR_DEPARTMENT_OTHER)
Admission: EM | Admit: 2013-04-06 | Discharge: 2013-04-06 | Disposition: A | Discharge status: Home / Self Care | Payer: BC Managed Care – PPO | Attending: Emergency Medicine | Admitting: Emergency Medicine

## 2013-04-06 ENCOUNTER — Encounter (HOSPITAL_BASED_OUTPATIENT_CLINIC_OR_DEPARTMENT_OTHER): Payer: Self-pay | Admitting: *Deleted

## 2013-04-06 ENCOUNTER — Ambulatory Visit (INDEPENDENT_AMBULATORY_CARE_PROVIDER_SITE_OTHER): Payer: BC Managed Care – PPO | Admitting: Family Medicine

## 2013-04-06 ENCOUNTER — Encounter: Payer: Self-pay | Admitting: Family Medicine

## 2013-04-06 ENCOUNTER — Emergency Department (HOSPITAL_BASED_OUTPATIENT_CLINIC_OR_DEPARTMENT_OTHER): Payer: BC Managed Care – PPO

## 2013-04-06 VITALS — BP 142/94 | HR 84

## 2013-04-06 DIAGNOSIS — R079 Chest pain, unspecified: Secondary | ICD-10-CM

## 2013-04-06 DIAGNOSIS — Z8739 Personal history of other diseases of the musculoskeletal system and connective tissue: Secondary | ICD-10-CM | POA: Insufficient documentation

## 2013-04-06 LAB — CBC WITH DIFFERENTIAL/PLATELET
Basophils Absolute: 0 10*3/uL (ref 0.0–0.1)
HCT: 43.2 % (ref 39.0–52.0)
Lymphocytes Relative: 27 % (ref 12–46)
Neutro Abs: 3.4 10*3/uL (ref 1.7–7.7)
Neutrophils Relative %: 64 % (ref 43–77)
Platelets: 212 10*3/uL (ref 150–400)
RDW: 11.9 % (ref 11.5–15.5)
WBC: 5.3 10*3/uL (ref 4.0–10.5)

## 2013-04-06 LAB — COMPREHENSIVE METABOLIC PANEL
ALT: 21 U/L (ref 0–53)
AST: 21 U/L (ref 0–37)
Albumin: 3.8 g/dL (ref 3.5–5.2)
CO2: 29 mEq/L (ref 19–32)
Chloride: 103 mEq/L (ref 96–112)
GFR calc non Af Amer: 68 mL/min — ABNORMAL LOW (ref >90)
Sodium: 140 mEq/L (ref 135–145)
Total Bilirubin: 0.8 mg/dL (ref 0.3–1.2)

## 2013-04-06 MED ORDER — ASPIRIN 81 MG PO CHEW
CHEWABLE_TABLET | ORAL | Status: AC
  Administered 2013-04-06: 162 mg via ORAL
  Filled 2013-04-06: qty 2

## 2013-04-06 MED ORDER — ASPIRIN 81 MG PO CHEW
162.0000 mg | CHEWABLE_TABLET | Freq: Once | ORAL | Status: AC
  Administered 2013-04-06: 162 mg via ORAL

## 2013-04-06 MED ORDER — KETOROLAC TROMETHAMINE 30 MG/ML IJ SOLN
30.0000 mg | Freq: Once | INTRAMUSCULAR | Status: AC
  Administered 2013-04-06: 30 mg via INTRAVENOUS
  Filled 2013-04-06: qty 1

## 2013-04-06 NOTE — Progress Notes (Signed)
  Subjective:    Patient ID: Francisco Carter, male    DOB: 1961/04/21, 52 y.o.   MRN: [id number]  HPI    Review of Systems     Objective:   Physical Exam        Assessment & Plan:  2 baby aspirin and 1 nitroglycerin was given to pt at 2:32 pm

## 2013-04-06 NOTE — ED Provider Notes (Signed)
History     CSN: [id number]  Arrival date & time 04/06/13  1438   First MD Initiated Contact with Patient 04/06/13 1508      Chief Complaint  Patient presents with  . Chest Pain    (Consider location/radiation/quality/duration/timing/severity/associated sxs/prior treatment) HPI Comments: Patient presents with tightness in the chest since about 1PM.  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  He denies shortness of breath, nausea, or radiation to the arm or jaw.  No exertional symptoms.    Patient is a 52 y.o. male presenting with chest pain. The history is provided by the patient.  Chest Pain Chest pain location: anterior chest. Pain quality: tightness   Pain radiates to:  Does not radiate Pain radiates to the back: no   Pain severity:  Mild Onset quality:  Sudden Duration:  2 hours Timing:  Constant Progression:  Partially resolved Chronicity:  New Context: breathing and movement   Context comment:  Position Relieved by:  Aspirin Worsened by:  Certain positions, coughing, deep breathing and movement Associated symptoms: no abdominal pain, no fever and no shortness of breath     Past Medical History  Diagnosis Date  . Arthritis     Past Surgical History  Procedure Laterality Date  . Rotator cuff repair      left    Family History  Problem Relation Age of Onset  . Cancer Mother     bone  . Hypertension Mother   . Heart disease Father   . Hypertension Father   . Colon cancer Neg Hx   . Esophageal cancer Neg Hx   . Rectal cancer Neg Hx   . Prostate cancer Neg Hx     History  Substance Use Topics  . Smoking status: Never Smoker   . Smokeless tobacco: Never Used  . Alcohol Use: Yes     Comment: occationally      Review of Systems  Constitutional: Negative for fever.  Respiratory: Negative for shortness of breath.   Cardiovascular: Positive for chest pain.  Gastrointestinal: Negative for abdominal pain.  All  other systems reviewed and are negative.    Allergies  Review of patient's allergies indicates no known allergies.  Home Medications  No current outpatient prescriptions on file.  There were no vitals taken for this visit.  Physical Exam  Nursing note and vitals reviewed. Constitutional: He is oriented to person, place, and time. He appears well-developed and well-nourished. No distress.  HENT:  Head: Normocephalic and atraumatic.  Mouth/Throat: Oropharynx is clear and moist.  Neck: Normal range of motion. Neck supple.  Cardiovascular: Normal rate, regular rhythm and normal heart sounds.   No murmur heard. Pulmonary/Chest: Effort normal and breath sounds normal. No respiratory distress.  Abdominal: Soft. Bowel sounds are normal.  Musculoskeletal: Normal range of motion. He exhibits no edema.  Neurological: He is alert and oriented to person, place, and time.  Skin: Skin is warm and dry. He is not diaphoretic.    ED Course  Procedures (including critical care time)  Labs Reviewed  CBC WITH DIFFERENTIAL  COMPREHENSIVE METABOLIC PANEL  TROPONIN I   Dg Chest 2 View  04/06/2013  *RADIOLOGY REPORT*  Clinical Data: Chest pain  CHEST - 2 VIEW  Comparison: None.  Findings: Lungs clear.  Heart size and pulmonary vascularity are normal.  No adenopathy.  No bone lesions.  No pneumothorax.  IMPRESSION:   No abnormality noted.   Original Report Authenticated By:  Bretta Bang, M.D.      No diagnosis found.   Date: 04/06/2013  Rate: 67  Rhythm: normal sinus rhythm  QRS Axis: normal  Intervals: normal  ST/T Wave abnormalities: normal  Conduction Disutrbances:none  Narrative Interpretation:   Old EKG Reviewed: unchanged    MDM  The patient presents with chest pain that started early this afternoon.  It is somewhat atypical in nature as it is worse with position and movement.  There is no exertional component and it is reproducible with palpation of the anterior chest wall.    The workup reveals no evidence for a cardiac etiology with a normal ekg and troponin times two.  He will be discharged with nsaids, follow up with pcp prn if not improving.  Return if he worsens.        Geoffery Lyons, MD 04/06/13 1700

## 2013-04-06 NOTE — ED Notes (Signed)
  Pt brought to ed from [employer] where he presented to an appointment with c/o chest pain. Pt trasported to ed in w/c by steve, rt. Pt is awake and alert, reports sudden onset of chest pressure, "all over, it moves all around" approx one hour ago. Pt was given ntg sl x 1 and asa 81mg  chewables x 2 at 1430. Pt states pain is worse with lying flat, arm movements, and deep inspiration.

## 2013-04-07 ENCOUNTER — Telehealth: Payer: Self-pay | Admitting: Family

## 2013-04-07 NOTE — Telephone Encounter (Signed)
Please call pt and arrange an ED follow up.

## 2013-04-08 ENCOUNTER — Encounter: Payer: Self-pay | Admitting: Family Medicine

## 2013-04-08 DIAGNOSIS — R079 Chest pain, unspecified: Secondary | ICD-10-CM

## 2013-04-08 HISTORY — DX: Chest pain, unspecified: R07.9

## 2013-04-08 NOTE — Assessment & Plan Note (Signed)
The patient walked into the lobby today complaining of substernal chest discomfort. He denies any precipitating factor and while discussing the case pain thought to shortness or breath and a sense of diaphoresis and anxiety. He is given 281 mg aspirin and sublingual nitroglycerin and is transferred to the emergency room for rule out. Does not note any change in his pain with nitroglycerin.

## 2013-04-08 NOTE — Progress Notes (Signed)
Patient ID: Francisco Carter, male   DOB: 09/20/1961, 52 y.o.   MRN: [id number] Francisco Carter [id number] Jul 12, 1961 04/08/2013      Progress Note-Follow Up  Subjective  Chief Complaint  Chief Complaint  Patient presents with  . Chest Pain    5 out of 10- pt walked in the office with CP, sweating, SOB    HPI  Patient is a 52 year old African American male who walked in today without an appointment complaining of substernal chest pain. He denies any falls or trauma. He denies any heartburn or strenuous activity. He describes substernal chest discomfort and while talking says he also has some shortness of breath and a sense of diaphoresis. Is anxious but denies any recent increased stressors. No GI or GU complaints  Past Medical History  Diagnosis Date  . Arthritis   . Chest pain 04/08/2013    Past Surgical History  Procedure Laterality Date  . Rotator cuff repair      left    Family History  Problem Relation Age of Onset  . Cancer Mother     bone  . Hypertension Mother   . Heart disease Father   . Hypertension Father   . Colon cancer Neg Hx   . Esophageal cancer Neg Hx   . Rectal cancer Neg Hx   . Prostate cancer Neg Hx     History   Social History  . Marital Status: Single    Spouse Name: N/A    Number of Children: N/A  . Years of Education: N/A   Occupational History  . Not on file.   Social History Main Topics  . Smoking status: Never Smoker   . Smokeless tobacco: Never Used  . Alcohol Use: Yes     Comment: occationally  . Drug Use: No  . Sexually Active: Not on file   Other Topics Concern  . Not on file   Social History Narrative  . No narrative on file    No current outpatient prescriptions on file prior to visit.   No current facility-administered medications on file prior to visit.    No Known Allergies  Review of Systems  Review of Systems  Constitutional: Positive for diaphoresis. Negative for fever and malaise/fatigue.  HENT:  Negative for congestion and sore throat.   Eyes: Negative for discharge.  Respiratory: Positive for shortness of breath. Negative for cough.   Cardiovascular: Positive for chest pain. Negative for palpitations and leg swelling.  Gastrointestinal: Negative for nausea, abdominal pain and diarrhea.  Genitourinary: Negative for dysuria.  Musculoskeletal: Negative for falls.  Skin: Negative for rash.  Neurological: Negative for loss of consciousness and headaches.  Endo/Heme/Allergies: Negative for polydipsia.  Psychiatric/Behavioral: Negative for depression and suicidal ideas. The patient is not nervous/anxious and does not have insomnia.     Objective  BP 142/94  Pulse 84  SpO2 99%  Physical Exam  Physical Exam  Constitutional: He is oriented to person, place, and time and well-developed, well-nourished, and in no distress. No distress.  HENT:  Head: Normocephalic and atraumatic.  Eyes: Conjunctivae are normal.  Neck: Neck supple. No thyromegaly present.  Cardiovascular: Normal rate, regular rhythm and normal heart sounds.   No murmur heard. Pulmonary/Chest: Effort normal and breath sounds normal. No respiratory distress.  Abdominal: He exhibits no distension and no mass. There is no tenderness.  Musculoskeletal: He exhibits no edema.  Neurological: He is alert and oriented to person, place, and time.  Skin: Skin is warm.  Psychiatric:  Memory, affect and judgment normal.    Lab Results  Component Value Date   TSH 1.538 05/06/2012   Lab Results  Component Value Date   WBC 5.3 04/06/2013   HGB 15.0 04/06/2013   HCT 43.2 04/06/2013   MCV 85.7 04/06/2013   PLT 212 04/06/2013   Lab Results  Component Value Date   CREATININE 1.20 04/06/2013   BUN 10 04/06/2013   NA 140 04/06/2013   K 4.0 04/06/2013   CL 103 04/06/2013   CO2 29 04/06/2013   Lab Results  Component Value Date   ALT 21 04/06/2013   AST 21 04/06/2013   ALKPHOS 82 04/06/2013   BILITOT 0.8 04/06/2013   Lab Results   Component Value Date   CHOL 142 05/06/2012   Lab Results  Component Value Date   HDL 43 05/06/2012   Lab Results  Component Value Date   LDLCALC 87 05/06/2012   Lab Results  Component Value Date   TRIG 62 05/06/2012   Lab Results  Component Value Date   CHOLHDL 3.3 05/06/2012     Assessment & Plan  Chest pain The patient walked into the lobby today complaining of substernal chest discomfort. He denies any precipitating factor and while discussing the case pain thought to shortness or breath and a sense of diaphoresis and anxiety. He is given 281 mg aspirin and sublingual nitroglycerin and is transferred to the emergency room for rule out. Does not note any change in his pain with nitroglycerin.

## 2013-04-11 NOTE — Telephone Encounter (Signed)
Left message for patient to return my call.

## 2013-04-18 NOTE — Telephone Encounter (Signed)
Has apt 5/30.

## 2013-04-25 ENCOUNTER — Ambulatory Visit (INDEPENDENT_AMBULATORY_CARE_PROVIDER_SITE_OTHER): Payer: BC Managed Care – PPO

## 2013-04-25 DIAGNOSIS — G4733 Obstructive sleep apnea (adult) (pediatric): Secondary | ICD-10-CM

## 2013-04-25 DIAGNOSIS — R0683 Snoring: Secondary | ICD-10-CM

## 2013-04-25 DIAGNOSIS — R0609 Other forms of dyspnea: Secondary | ICD-10-CM

## 2013-05-09 ENCOUNTER — Telehealth: Payer: Self-pay | Admitting: Family

## 2013-05-09 DIAGNOSIS — G4733 Obstructive sleep apnea (adult) (pediatric): Secondary | ICD-10-CM

## 2013-05-09 NOTE — Telephone Encounter (Signed)
Message copied by Sandford Craze on Tue May 09, 2013 10:00 AM ------      Message from: Cyril Mourning V      Created: Fri May 05, 2013  1:15 PM       Severe OSA      Needs in lab CPAP titration study - I can read            RA ------

## 2013-05-09 NOTE — Telephone Encounter (Signed)
Left message to return my call.  

## 2013-05-09 NOTE — Telephone Encounter (Signed)
Please call pt and let him know that sleep study shows severe sleep apnea.  He will need a CPAP titration study in the lab.  Will arrange.

## 2013-05-09 NOTE — Telephone Encounter (Signed)
Notified pt and he voices understanding. 

## 2013-05-12 ENCOUNTER — Ambulatory Visit (INDEPENDENT_AMBULATORY_CARE_PROVIDER_SITE_OTHER): Payer: BC Managed Care – PPO | Admitting: Family

## 2013-05-12 ENCOUNTER — Encounter: Payer: Self-pay | Admitting: Family

## 2013-05-12 VITALS — BP 128/90 | HR 67 | Temp 98.6°F | Resp 16 | Ht 72.0 in | Wt 180.0 lb

## 2013-05-12 DIAGNOSIS — Z0000 Encounter for general adult medical examination without abnormal findings: Secondary | ICD-10-CM

## 2013-05-12 LAB — HEPATIC FUNCTION PANEL
ALT: 15 U/L (ref 0–53)
AST: 17 U/L (ref 0–37)
Albumin: 4.4 g/dL (ref 3.5–5.2)
Alkaline Phosphatase: 69 U/L (ref 39–117)
Indirect Bilirubin: 0.7 mg/dL (ref 0.0–0.9)
Total Protein: 6.7 g/dL (ref 6.0–8.3)

## 2013-05-12 LAB — CBC WITH DIFFERENTIAL/PLATELET
Eosinophils Absolute: 0.1 10*3/uL (ref 0.0–0.7)
Eosinophils Relative: 2 % (ref 0–5)
HCT: 42.7 % (ref 39.0–52.0)
Lymphocytes Relative: 24 % (ref 12–46)
Lymphs Abs: 1.3 10*3/uL (ref 0.7–4.0)
MCH: 29.1 pg (ref 26.0–34.0)
MCV: 84.6 fL (ref 78.0–100.0)
Monocytes Absolute: 0.3 10*3/uL (ref 0.1–1.0)
Platelets: 219 10*3/uL (ref 150–400)
RBC: 5.05 MIL/uL (ref 4.22–5.81)

## 2013-05-12 LAB — PSA: PSA: 1.39 ng/mL (ref <=4.00)

## 2013-05-12 LAB — LIPID PANEL
HDL: 51 mg/dL (ref >39)
LDL Cholesterol: 83 mg/dL (ref 0–99)

## 2013-05-12 LAB — BASIC METABOLIC PANEL WITH GFR
CO2: 29 mEq/L (ref 19–32)
Calcium: 9.1 mg/dL (ref 8.4–10.5)
Chloride: 106 mEq/L (ref 96–112)
Creat: 1.06 mg/dL (ref 0.50–1.35)
Glucose, Bld: 97 mg/dL (ref 70–99)
Sodium: 142 mEq/L (ref 135–145)

## 2013-05-12 LAB — TSH: TSH: 1.626 u[IU]/mL (ref 0.350–4.500)

## 2013-05-12 NOTE — Progress Notes (Signed)
Subjective:    Patient ID: Francisco Carter, male    DOB: [birth year], 52 y.o.   MRN: [id number]  HPI  Patient presents today for complete physical.  Immunizations: up to date Diet: reports healthy diet Exercise: reports lots of physical activity in his work.  Colonoscopy:  Up to date   Review of Systems  Constitutional: Negative for unexpected weight change.  HENT: Negative for hearing loss and congestion.   Eyes: Negative for visual disturbance.  Respiratory: Negative for cough.   Cardiovascular:       Denies further chest pain since ED visit  Gastrointestinal: Negative for vomiting, abdominal pain, diarrhea, constipation and blood in stool.  Genitourinary: Negative for dysuria, frequency and hematuria.  Musculoskeletal: Negative for myalgias and arthralgias.  Skin: Negative for rash.  Neurological: Negative for headaches.  Hematological: Negative for adenopathy.  Psychiatric/Behavioral:       Denies depression/anxiety     Past Medical History  Diagnosis Date  . Arthritis   . Chest pain 04/08/2013    History   Social History  . Marital Status: Single    Spouse Name: N/A    Number of Children: N/A  . Years of Education: N/A   Occupational History  . Not on file.   Social History Main Topics  . Smoking status: Never Smoker   . Smokeless tobacco: Never Used  . Alcohol Use: Yes     Comment: occationally  . Drug Use: No  . Sexually Active: Not on file   Other Topics Concern  . Not on file   Social History Narrative  . No narrative on file    Past Surgical History  Procedure Laterality Date  . Rotator cuff repair      left    Family History  Problem Relation Age of Onset  . Cancer Mother     bone  . Hypertension Mother   . Heart disease Father   . Hypertension Father   . Colon cancer Neg Hx   . Esophageal cancer Neg Hx   . Rectal cancer Neg Hx   . Prostate cancer Neg Hx     No Known Allergies  No current outpatient prescriptions on file  prior to visit.   No current facility-administered medications on file prior to visit.    BP 128/90  Pulse 67  Temp(Src) 98.6 F (37 C) (Oral)  Resp 16  Ht 6' (1.829 m)  Wt 180 lb (81.647 kg)  BMI 24.41 kg/m2  SpO2 97%       Objective:   Physical Exam  Physical Exam  Constitutional: He is oriented to person, place, and time. He appears well-developed and well-nourished. No distress.  HENT:  Head: Normocephalic and atraumatic.  Right Ear: Tympanic membrane and ear canal normal.  Left Ear: Tympanic membrane and ear canal normal.  Mouth/Throat: Oropharynx is clear and moist.  Eyes: Pupils are equal, round, and reactive to light. No scleral icterus.  Neck: Normal range of motion. No thyromegaly present.  Cardiovascular: Normal rate and regular rhythm.   No murmur heard. Pulmonary/Chest: Effort normal and breath sounds normal. No respiratory distress. He has no wheezes. He has no rales. He exhibits no tenderness.  Abdominal: Soft. Bowel sounds are normal. He exhibits no distension and no mass. There is no tenderness. There is no rebound and no guarding.  Musculoskeletal: He exhibits no edema.  Lymphadenopathy:    He has no cervical adenopathy.  Neurological: He is alert and oriented to person, place, and time.  He exhibits normal muscle tone. Coordination normal.  Skin: Skin is warm and dry.  Psychiatric: He has a normal mood and affect. His behavior is normal. Judgment and thought content normal.          Assessment & Plan:         Assessment & Plan:

## 2013-05-12 NOTE — Assessment & Plan Note (Signed)
Continue healthy diet, exercise.  Obtain fasting labs including PSA (discussed pros/cons).  Immunizations/colo up to date.

## 2013-05-12 NOTE — Patient Instructions (Addendum)
Continue healthy diet and exercise. Complete your lab work prior to leaving.  Follow up in 6 months.  We will let you know based on your sleep study results if we need to see you back sooner.

## 2013-05-13 LAB — URINALYSIS, ROUTINE W REFLEX MICROSCOPIC
Bilirubin Urine: NEGATIVE
Nitrite: NEGATIVE
Protein, ur: NEGATIVE mg/dL
Specific Gravity, Urine: 1.021 (ref 1.005–1.030)
Urobilinogen, UA: 1 mg/dL (ref 0.0–1.0)

## 2013-05-21 ENCOUNTER — Encounter: Payer: Self-pay | Admitting: Family

## 2013-06-06 ENCOUNTER — Ambulatory Visit (HOSPITAL_BASED_OUTPATIENT_CLINIC_OR_DEPARTMENT_OTHER): Payer: BC Managed Care – PPO | Attending: Family

## 2013-06-06 VITALS — Ht 73.0 in | Wt 186.0 lb

## 2013-06-06 DIAGNOSIS — G4733 Obstructive sleep apnea (adult) (pediatric): Secondary | ICD-10-CM | POA: Insufficient documentation

## 2013-06-07 DIAGNOSIS — R0989 Other specified symptoms and signs involving the circulatory and respiratory systems: Secondary | ICD-10-CM

## 2013-06-07 DIAGNOSIS — G4730 Sleep apnea, unspecified: Secondary | ICD-10-CM

## 2013-06-07 DIAGNOSIS — G4710 Hypersomnia, unspecified: Secondary | ICD-10-CM

## 2013-06-07 DIAGNOSIS — R0609 Other forms of dyspnea: Secondary | ICD-10-CM

## 2013-06-08 NOTE — Procedures (Signed)
NAME:  Francisco Carter, Francisco Carter NO.:  [id number]  MEDICAL RECORD NO.:  [id number]          PATIENT TYPE:  OUT  LOCATION:  SLEEP CENTER                 FACILITY:  [facility]  PHYSICIAN:  Oretha Milch, MD      DATE OF BIRTH:  Nov 21, 1961  DATE OF STUDY:  06/06/2013                           NOCTURNAL POLYSOMNOGRAM  REFERRING PHYSICIAN:  Sandford Craze, NP  INDICATION FOR THE STUDY:  Mr. Mcleish is a 51 year old gentleman with witnessed apneas, loud snoring, excessive daytime fatigue.  At the time of this study, he weighed 186 pounds with a height of 6 feet 1 inch, BMI of 25, neck size of 15.5 inch.  EPWORTH SLEEPINESS SCORE:  13.  Prior home study showed obstructive sleep apnea.  This CPAP titration study was performed with sleep technologist in attendance.  EEG, EOG, EMG, EKG, and respiratory parameters were recorded.  Sleep stages, arousals, limb movements, respiratory data were scored according to criteria laid out by the American Academy of Sleep Medicine.  MEDICATIONS:  SLEEP ARCHITECTURE:  Lights out was at 9:37 p.m., lights on was at 4:43 a.m.  Total sleep time was 316 minutes with a sleep period time of 396 minutes with sleep latency of 20 minutes.  Wake after sleep onset was 90 minutes.  Sleep stages as a percentage of total sleep time was N1 7%, N2 74%, N3 0.2% and REM sleep 19%.  Supine sleep accounted for 16 minutes. REM sleep was noted in 2 periods, the longest around 2 a.m.  Arousal Data:  There were 49 arousals with an arousal index of 90 events per hour.  Of these, 22 were spontaneous and the rest were associated with respiratory events.  RESPIRATORY DATA:  CPAP was initiated at 5 cm.  Due to respiratory events and snoring CPAP was titrated to 11 cm at this level for 49.5 minutes including 5 minutes of REM sleep, 3 obstructive apneas and 4 hypopneas were noted with an AHI of 8.5 events per hour and a low desaturation of 94%.  Central apneas seem to  emerge about this level and a few mixed apneas were also noted.  At a CPAP level of 217 cm for 20 minutes 14 obstructive apneas and 3 central apneas were noted and a desaturation of 92%.  He was then switched to a bind level of 20/16.  No events were noted at this level.  Limb Movement Data:  PLM index was 1.5 events per hour.  OXYGEN DATA:  Desaturation index was 11 events per hour with the lowest desaturation of 91%.  CARDIAC DATA:  The low heart rate was 43 beats per minute.  The high heart rate was an artifact.  No arrhythmias were noted.  DISCUSSION:  He was desensitized with a medium full-face mask.  MOVEMENT-PARASOMNIA:  IMPRESSIONS-RECOMMENDATIONS: 1. Sleep disordered breathing was corrected by CPAP therapy.  Pressure     of 11 cm appears to be adequate since central apneas seem to emerge     higher than this level.  Alternatively, auto CPAP of 5-15 cm can     also be tried with a medium full-face mask. 2. No evidence of cardiac arrhythmias, or behavioral disturbance  during sleep.  RECOMMENDATION: 1. Auto CPAP of 5-15 cm.  Compliance can be monitored at this level.     Download can be obtained to ensure that pressure is adequate. 2. He should be cautioned against driving when sleepy. 3. He should be asked to avoid medications with sedative side effects.     Oretha Milch, MD    RVA/MEDQ  D:  06/07/2013 14:00:25  T:  06/08/2013 01:54:12  Job:  [id number]

## 2013-06-12 ENCOUNTER — Telehealth: Payer: Self-pay | Admitting: Family

## 2013-06-12 DIAGNOSIS — G4733 Obstructive sleep apnea (adult) (pediatric): Secondary | ICD-10-CM

## 2013-06-12 NOTE — Telephone Encounter (Signed)
Sleep study confirms sleep apnea.  I will arrange home cpap for him to start.   He should wear CPAP every night as much as tolerated.  Avoid driving while sleepy.

## 2013-06-13 NOTE — Telephone Encounter (Signed)
LMOM with contact name and number for return call RE: sleep study results and further provider instructions/SLS

## 2013-06-14 NOTE — Telephone Encounter (Signed)
Patient informed, understood & agreed/SLS  

## 2013-09-15 ENCOUNTER — Encounter: Payer: Self-pay | Admitting: Family

## 2013-09-15 ENCOUNTER — Ambulatory Visit (INDEPENDENT_AMBULATORY_CARE_PROVIDER_SITE_OTHER): Payer: BC Managed Care – PPO | Admitting: Family

## 2013-09-15 VITALS — BP 110/80 | HR 63 | Temp 97.9°F | Resp 16 | Ht 72.0 in | Wt 183.1 lb

## 2013-09-15 DIAGNOSIS — M79642 Pain in left hand: Secondary | ICD-10-CM

## 2013-09-15 DIAGNOSIS — Z23 Encounter for immunization: Secondary | ICD-10-CM

## 2013-09-15 DIAGNOSIS — M79643 Pain in unspecified hand: Secondary | ICD-10-CM | POA: Insufficient documentation

## 2013-09-15 DIAGNOSIS — M79609 Pain in unspecified limb: Secondary | ICD-10-CM

## 2013-09-15 NOTE — Progress Notes (Signed)
  Subjective:    Patient ID: Francisco Carter, male    DOB: [birth year], 52 y.o.   MRN: [id number]  HPI  Francisco Carter is a 52 yr old male who presents today for follow up.  1) OSA- reports that he using as much as he can.  Reports feeling much better since he started his CPAP.   Less tired during the day.    2) Hand Pain- Reports some pain in the right hand- 2 x week.  Denies associated numbness.  Bends his wrist frequently with his job.     Review of Systems  Past Medical History  Diagnosis Date  . Arthritis   . Chest pain 04/08/2013    History   Social History  . Marital Status: Single    Spouse Name: N/A    Number of Children: N/A  . Years of Education: N/A   Occupational History  . Not on file.   Social History Main Topics  . Smoking status: Never Smoker   . Smokeless tobacco: Never Used  . Alcohol Use: Yes     Comment: occationally  . Drug Use: No  . Sexual Activity: Not on file   Other Topics Concern  . Not on file   Social History Narrative  . No narrative on file    Past Surgical History  Procedure Laterality Date  . Rotator cuff repair      left    Family History  Problem Relation Age of Onset  . Cancer Mother     bone  . Hypertension Mother   . Heart disease Father   . Hypertension Father   . Colon cancer Neg Hx   . Esophageal cancer Neg Hx   . Rectal cancer Neg Hx   . Prostate cancer Neg Hx     No Known Allergies  No current outpatient prescriptions on file prior to visit.   No current facility-administered medications on file prior to visit.    BP 110/80  Pulse 63  Temp(Src) 97.9 F (36.6 C) (Oral)  Resp 16  Ht 6' (1.829 m)  Wt 183 lb 1.9 oz (83.063 kg)  BMI 24.83 kg/m2  SpO2 98%       Objective:   Physical Exam  Constitutional: He is oriented to person, place, and time. He appears well-developed and well-nourished. No distress.  HENT:  Head: Normocephalic and atraumatic.  Cardiovascular: Normal rate and regular rhythm.    No murmur heard. Pulmonary/Chest: Effort normal and breath sounds normal. No respiratory distress. He has no wheezes. He has no rales. He exhibits no tenderness.  Musculoskeletal: He exhibits no edema.  Left wrist without swelling or tenderness. Neg phalans, neg tinels.   Neurological: He is alert and oriented to person, place, and time.  Psychiatric: He has a normal mood and affect. His behavior is normal. Judgment and thought content normal.          Assessment & Plan:

## 2013-09-15 NOTE — Assessment & Plan Note (Signed)
Likely mild OA. Advised pt they he can use occasional nsaids such as aleve but to be careful not to overuse. Pt verbalizes understanding.

## 2013-09-15 NOTE — Patient Instructions (Addendum)
Please schedule fasting physical after 05/27/14. Continue cpap.

## 2013-11-03 ENCOUNTER — Ambulatory Visit: Payer: BC Managed Care – PPO | Admitting: Family

## 2013-11-10 ENCOUNTER — Ambulatory Visit (INDEPENDENT_AMBULATORY_CARE_PROVIDER_SITE_OTHER): Payer: BC Managed Care – PPO | Admitting: Family

## 2013-11-10 ENCOUNTER — Encounter: Payer: Self-pay | Admitting: Family

## 2013-11-10 DIAGNOSIS — G4733 Obstructive sleep apnea (adult) (pediatric): Secondary | ICD-10-CM

## 2013-11-10 DIAGNOSIS — M79609 Pain in unspecified limb: Secondary | ICD-10-CM

## 2013-11-10 DIAGNOSIS — J329 Chronic sinusitis, unspecified: Secondary | ICD-10-CM

## 2013-11-10 MED ORDER — AMOXICILLIN 500 MG PO CAPS: 500.0000 mg | ORAL_CAPSULE | Freq: Three times a day (TID) | ORAL | Status: DC

## 2013-11-10 NOTE — Progress Notes (Signed)
Pre visit review using our clinic review tool, if applicable. No additional management support is needed unless otherwise documented below in the visit note. 

## 2013-11-10 NOTE — Assessment & Plan Note (Signed)
Pt instructed to resume CPAP.

## 2013-11-10 NOTE — Progress Notes (Signed)
   Subjective:    Patient ID: Francisco Carter, male    DOB: [birth year], 52 y.o.   MRN: [id number]  HPI  Mr. Peasley is a 52 yr old male who presents today with malaise. Reports runny nose, sore throat.  Symptoms started about 10 days. Denies associated fever. Reports + yellow sinus drainage.  Reports that he has tried some otc meds without much relief. Denies associated fever.  L hand pain- resolved.   OSA- reports that he has not recently been using CPAP due to nasal congestion.  Review of Systems See HPI  Past Medical History  Diagnosis Date  . Arthritis   . Chest pain 04/08/2013    History   Social History  . Marital Status: Single    Spouse Name: N/A    Number of Children: N/A  . Years of Education: N/A   Occupational History  . Not on file.   Social History Main Topics  . Smoking status: Never Smoker   . Smokeless tobacco: Never Used  . Alcohol Use: Yes     Comment: occationally  . Drug Use: No  . Sexual Activity: Not on file   Other Topics Concern  . Not on file   Social History Narrative  . No narrative on file    Past Surgical History  Procedure Laterality Date  . Rotator cuff repair      left    Family History  Problem Relation Age of Onset  . Cancer Mother     bone  . Hypertension Mother   . Heart disease Father   . Hypertension Father   . Colon cancer Neg Hx   . Esophageal cancer Neg Hx   . Rectal cancer Neg Hx   . Prostate cancer Neg Hx     No Known Allergies  No current outpatient prescriptions on file prior to visit.   No current facility-administered medications on file prior to visit.    BP 120/96  Pulse 84  Temp(Src) 97.8 F (36.6 C) (Oral)  Resp 16  Ht 6' (1.829 m)  Wt 189 lb 0.6 oz (85.748 kg)  BMI 25.63 kg/m2  SpO2 98%       Objective:   Physical Exam  Constitutional: He is oriented to person, place, and time. He appears well-developed and well-nourished. No distress.  HENT:  Head: Normocephalic and  atraumatic.  Right Ear: Tympanic membrane and ear canal normal.  Left Ear: Tympanic membrane and ear canal normal.  Cardiovascular: Normal rate and regular rhythm.   No murmur heard. Pulmonary/Chest: Effort normal and breath sounds normal. No respiratory distress. He has no wheezes. He has no rales. He exhibits no tenderness.  Lymphadenopathy:    He has cervical adenopathy.  Neurological: He is alert and oriented to person, place, and time.  Skin: Skin is warm and dry.  Psychiatric: He has a normal mood and affect. His behavior is normal. Judgment and thought content normal.          Assessment & Plan:

## 2013-11-10 NOTE — Assessment & Plan Note (Signed)
Will rx with amoxicillin.  Pt instructed to call if symptoms worsen or if not improved in 2-3 days.

## 2013-11-10 NOTE — Assessment & Plan Note (Signed)
Resolved

## 2013-11-10 NOTE — Patient Instructions (Signed)

## 2014-05-18 ENCOUNTER — Telehealth: Payer: Self-pay | Admitting: Family

## 2014-05-18 ENCOUNTER — Encounter: Payer: BC Managed Care – PPO | Admitting: Family

## 2014-05-18 ENCOUNTER — Encounter: Payer: Self-pay | Admitting: Family

## 2014-05-18 DIAGNOSIS — Z0289 Encounter for other administrative examinations: Secondary | ICD-10-CM

## 2014-05-18 NOTE — Telephone Encounter (Signed)
Please contact pt to reschedule

## 2014-05-18 NOTE — Telephone Encounter (Signed)
Left message for patient to return my call. Letter sent.

## 2014-05-18 NOTE — Telephone Encounter (Signed)
Message copied by Debbrah Alar on Fri May 18, 2014  8:10 AM ------      Message from: Elveria Royals      Created: Fri May 18, 2014  7:47 AM       Patient was scheduled for cpe and did not come to the appointment ------

## 2014-06-19 ENCOUNTER — Encounter: Payer: BC Managed Care – PPO | Admitting: Family

## 2014-06-19 DIAGNOSIS — Z0289 Encounter for other administrative examinations: Secondary | ICD-10-CM

## 2014-11-16 ENCOUNTER — Ambulatory Visit (INDEPENDENT_AMBULATORY_CARE_PROVIDER_SITE_OTHER): Payer: BC Managed Care – PPO | Admitting: *Deleted

## 2014-11-16 ENCOUNTER — Ambulatory Visit (INDEPENDENT_AMBULATORY_CARE_PROVIDER_SITE_OTHER): Payer: BC Managed Care – PPO | Admitting: Family

## 2014-11-16 ENCOUNTER — Encounter: Payer: Self-pay | Admitting: Family

## 2014-11-16 VITALS — BP 130/100 | HR 65 | Temp 98.0°F | Resp 16 | Ht 72.0 in | Wt 187.0 lb

## 2014-11-16 DIAGNOSIS — IMO0001 Reserved for inherently not codable concepts without codable children: Secondary | ICD-10-CM

## 2014-11-16 DIAGNOSIS — Z23 Encounter for immunization: Secondary | ICD-10-CM

## 2014-11-16 DIAGNOSIS — R030 Elevated blood-pressure reading, without diagnosis of hypertension: Secondary | ICD-10-CM

## 2014-11-16 DIAGNOSIS — Z0000 Encounter for general adult medical examination without abnormal findings: Secondary | ICD-10-CM

## 2014-11-16 DIAGNOSIS — R0789 Other chest pain: Secondary | ICD-10-CM | POA: Insufficient documentation

## 2014-11-16 DIAGNOSIS — M25522 Pain in left elbow: Secondary | ICD-10-CM | POA: Insufficient documentation

## 2014-11-16 DIAGNOSIS — G4733 Obstructive sleep apnea (adult) (pediatric): Secondary | ICD-10-CM

## 2014-11-16 DIAGNOSIS — I10 Essential (primary) hypertension: Secondary | ICD-10-CM | POA: Insufficient documentation

## 2014-11-16 MED ORDER — MELOXICAM 7.5 MG PO TABS: 7.5000 mg | ORAL_TABLET | Freq: Every day | ORAL | Status: DC

## 2014-11-16 NOTE — Progress Notes (Signed)
Pre visit review using our clinic review tool, if applicable. No additional management support is needed unless otherwise documented below in the visit note. 

## 2014-11-16 NOTE — Assessment & Plan Note (Signed)
Trial of meloxicam.  If not improving, plan referral to ortho.

## 2014-11-16 NOTE — Patient Instructions (Signed)
Please complete lab work prior to leaving. You will be contacted about your exercise stress test.  Go to ER if you develop chest pain that does not resolve with rest.  Work on low sodium diet. Follow up in 3 months.   Low-Sodium Eating Plan Sodium raises blood pressure and causes water to be held in the body. Getting less sodium from food will help lower your blood pressure, reduce any swelling, and protect your heart, liver, and kidneys. We get sodium by adding salt (sodium chloride) to food. Most of our sodium comes from canned, boxed, and frozen foods. Restaurant foods, fast foods, and pizza are also very high in sodium. Even if you take medicine to lower your blood pressure or to reduce fluid in your body, getting less sodium from your food is important. WHAT IS MY PLAN? Most people should limit their sodium intake to 2,300 mg a day. Your health care provider recommends that you limit your sodium intake to __________ a day.  WHAT DO I NEED TO KNOW ABOUT THIS EATING PLAN? For the low-sodium eating plan, you will follow these general guidelines:  Choose foods with a % Daily Value for sodium of less than 5% (as listed on the food label).   Use salt-free seasonings or herbs instead of table salt or sea salt.   Check with your health care provider or pharmacist before using salt substitutes.   Eat fresh foods.  Eat more vegetables and fruits.  Limit canned vegetables. If you do use them, rinse them well to decrease the sodium.   Limit cheese to 1 oz (28 g) per day.   Eat lower-sodium products, often labeled as "lower sodium" or "no salt added."  Avoid foods that contain monosodium glutamate (MSG). MSG is sometimes added to [place] food and some canned foods.  Check food labels (Nutrition Facts labels) on foods to learn how much sodium is in one serving.  Eat more home-cooked food and less restaurant, buffet, and fast food.  When eating at a restaurant, ask that your food be  prepared with less salt or none, if possible.  HOW DO I READ FOOD LABELS FOR SODIUM INFORMATION? The Nutrition Facts label lists the amount of sodium in one serving of the food. If you eat more than one serving, you must multiply the listed amount of sodium by the number of servings. Food labels may also identify foods as:  Sodium free--Less than 5 mg in a serving.  Very low sodium--35 mg or less in a serving.  Low sodium--140 mg or less in a serving.  Light in sodium--50% less sodium in a serving. For example, if a food that usually has 300 mg of sodium is changed to become light in sodium, it will have 150 mg of sodium.  Reduced sodium--25% less sodium in a serving. For example, if a food that usually has 400 mg of sodium is changed to reduced sodium, it will have 300 mg of sodium. WHAT FOODS CAN I EAT? Grains Low-sodium cereals, including oats, puffed wheat and rice, and shredded wheat cereals. Low-sodium crackers. Unsalted rice and pasta. Lower-sodium bread.  Vegetables Frozen or fresh vegetables. Low-sodium or reduced-sodium canned vegetables. Low-sodium or reduced-sodium tomato sauce and paste. Low-sodium or reduced-sodium tomato and vegetable juices.  Fruits Fresh, frozen, and canned fruit. Fruit juice.  Meat and Other Protein Products Low-sodium canned tuna and salmon. Fresh or frozen meat, poultry, seafood, and fish. Lamb. Unsalted nuts. Dried beans, peas, and lentils without added salt. Unsalted canned  beans. Homemade soups without salt. Eggs.  Dairy Milk. Soy milk. Ricotta cheese. Low-sodium or reduced-sodium cheeses. Yogurt.  Condiments Fresh and dried herbs and spices. Salt-free seasonings. Onion and garlic powders. Low-sodium varieties of mustard and ketchup. Lemon juice.  Fats and Oils Reduced-sodium salad dressings. Unsalted butter.  Other Unsalted popcorn and pretzels.  The items listed above may not be a complete list of recommended foods or beverages.  Contact your dietitian for more options. WHAT FOODS ARE NOT RECOMMENDED? Grains Instant hot cereals. Bread stuffing, pancake, and biscuit mixes. Croutons. Seasoned rice or pasta mixes. Noodle soup cups. Boxed or frozen macaroni and cheese. Self-rising flour. Regular salted crackers. Vegetables Regular canned vegetables. Regular canned tomato sauce and paste. Regular tomato and vegetable juices. Frozen vegetables in sauces. Salted french fries. Olives. Angie Fava. Relishes. Sauerkraut. Salsa. Meat and Other Protein Products Salted, canned, smoked, spiced, or pickled meats, seafood, or fish. Bacon, ham, sausage, hot dogs, corned beef, chipped beef, and packaged luncheon meats. Salt pork. Jerky. Pickled herring. Anchovies, regular canned tuna, and sardines. Salted nuts. Dairy Processed cheese and cheese spreads. Cheese curds. Blue cheese and cottage cheese. Buttermilk.  Condiments Onion and garlic salt, seasoned salt, table salt, and sea salt. Canned and packaged gravies. Worcestershire sauce. Tartar sauce. Barbecue sauce. Teriyaki sauce. Soy sauce, including reduced sodium. Steak sauce. Fish sauce. Oyster sauce. Cocktail sauce. Horseradish. Regular ketchup and mustard. Meat flavorings and tenderizers. Bouillon cubes. Hot sauce. Tabasco sauce. Marinades. Taco seasonings. Relishes. Fats and Oils Regular salad dressings. Salted butter. Margarine. Ghee. Bacon fat.  Other Potato and tortilla chips. Corn chips and puffs. Salted popcorn and pretzels. Canned or dried soups. Pizza. Frozen entrees and pot pies.  The items listed above may not be a complete list of foods and beverages to avoid. Contact your dietitian for more information. Document Released: 05/22/2002 Document Revised: 12/05/2013 Document Reviewed: 10/04/2013 [facility] Patient Information 2015 [place], [place]. This information is not intended to replace advice given to you by your health care provider. Make sure you discuss any questions  you have with your health care provider.

## 2014-11-16 NOTE — Assessment & Plan Note (Signed)
Flu shot today. Discussed healthy diet, exercise. Routine non-fasting labs today.

## 2014-11-16 NOTE — Assessment & Plan Note (Addendum)
Discussed low sodium diet, exercise. Will also try to get OSA treatment worked out. Repeat bp in 3 months, if still elevated initiate antihypertensive.

## 2014-11-16 NOTE — Assessment & Plan Note (Signed)
Refer for exercise stress test. Advised pt to go to ED as outlined in AVS.

## 2014-11-16 NOTE — Progress Notes (Signed)
Subjective:    Patient ID: Francisco Carter, male    DOB: July 23, 1961, 53 y.o.   MRN: [id number]  HPI  Francisco Carter is a 53 yr old male who presents today for complete physical.    Patient presents today for complete physical.  Immunizations: flu shot today, tetanus up to date Diet: too much fast food Exercise: active in his job  Colonoscopy: 2013- normal Eye exam- will make appointment Dental- reports that he is due for dental- will arrange through [place]  BP Readings from Last 3 Encounters:  11/16/14 130/100  11/10/13 120/96  09/15/13 110/80   OSA- not tolerant to full facemask. Has not been compliant as a result. He is motivated to find a way to make it more comfortable so he can wear it.   Review of Systems  Constitutional:       Wt Readings from Last 3 Encounters: 11/16/14 : 187 lb (84.823 kg) 11/10/13 : 189 lb 0.6 oz (85.748 kg) 09/15/13 : 183 lb 1.9 oz (83.063 kg)   HENT: Negative for hearing loss and rhinorrhea.   Eyes:       Wears reading glasses  Respiratory: Negative for cough.   Cardiovascular:       Reports that yesterday he developed some shortness of breath and mild chest tightness.  Denied pain.  Resolved with rest.  Non-radiating, no nausea.  Gastrointestinal: Negative for nausea, vomiting, diarrhea and constipation.  Genitourinary:       + nocturia, reports that he gets up 2-3 times a night.  Happens only if he drinks water/beer before bed, otherwise nocturia x 1  Musculoskeletal: Negative for myalgias and arthralgias.       Left elbow pain- x a few months  Skin: Negative for rash.  Hematological: Negative for adenopathy.  Psychiatric/Behavioral:       Denies depression/anxiety   Past Medical History  Diagnosis Date  . Arthritis   . Chest pain 04/08/2013    History   Social History  . Marital Status: Single    Spouse Name: N/A    Number of Children: N/A  . Years of Education: N/A   Occupational History  . Not on file.   Social  History Main Topics  . Smoking status: Never Smoker   . Smokeless tobacco: Never Used  . Alcohol Use: Yes     Comment: occationally  . Drug Use: No  . Sexual Activity: Not on file   Other Topics Concern  . 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             Past Surgical History  Procedure Laterality Date  . Rotator cuff repair      left    Family History  Problem Relation Age of Onset  . Cancer Mother     bone  . Hypertension Mother   . Heart disease Father   . Hypertension Father   . Colon cancer Neg Hx   . Esophageal cancer Neg Hx   . Rectal cancer Neg Hx   . Prostate cancer Neg Hx   . Lupus Sister   . COPD Brother   . Kidney disease Maternal Uncle     ?kidney failure    No Known Allergies  No current outpatient prescriptions on file prior to visit.   No current facility-administered medications on file prior to visit.  BP 130/100 mmHg  Pulse 65  Temp(Src) 98 F (36.7 C) (Oral)  Resp 16  Ht 6' (1.829 m)  Wt 187 lb (84.823 kg)  BMI 25.36 kg/m2  SpO2 96%        Objective:   Physical Exam Physical Exam  Constitutional: He is oriented to person, place, and time. He appears well-developed and well-nourished. No distress.  HENT:  Head: Normocephalic and atraumatic.  Right Ear: Tympanic membrane and ear canal normal.  Left Ear: Tympanic membrane and ear canal normal.  Mouth/Throat: Oropharynx is clear and moist.  Eyes: Pupils are equal, round, and reactive to light. No scleral icterus.  Neck: Normal range of motion. No thyromegaly present.  Cardiovascular: Normal rate and regular rhythm.   No murmur heard. Pulmonary/Chest: Effort normal and breath sounds normal. No respiratory distress. He has no wheezes. He has no rales. He exhibits no tenderness.  Abdominal: Soft. Bowel sounds are normal. He exhibits no distension and no mass. There is  no tenderness. There is no rebound and no guarding.  Musculoskeletal: He exhibits no edema.  Lymphadenopathy:    He has no cervical adenopathy.  Neurological: He is alert and oriented to person, place, and time. He has normal patellar reflexes. He exhibits normal muscle tone. Coordination normal.  Skin: Skin is warm and dry. some wart like growths noted on left forehead and right neck. Psychiatric: He has a normal mood and affect. His behavior is normal. Judgment and thought content normal.          Assessment & Plan:          Assessment & Plan:

## 2014-11-16 NOTE — Assessment & Plan Note (Addendum)
Will look into possibly initiating nasal cpap since he is not tolerating full face mask. Will discuss with Dr. Elsworth Soho.

## 2014-11-17 LAB — CBC WITH DIFFERENTIAL/PLATELET
BASOS ABS: 0 10*3/uL (ref 0.0–0.1)
BASOS PCT: 0.6 % (ref 0.0–3.0)
EOS ABS: 0.2 10*3/uL (ref 0.0–0.7)
Eosinophils Relative: 2.4 % (ref 0.0–5.0)
HCT: 45.3 % (ref 39.0–52.0)
HEMOGLOBIN: 15 g/dL (ref 13.0–17.0)
LYMPHS PCT: 23.3 % (ref 12.0–46.0)
Lymphs Abs: 1.5 10*3/uL (ref 0.7–4.0)
MCHC: 33.1 g/dL (ref 30.0–36.0)
MCV: 87.9 fl (ref 78.0–100.0)
MONOS PCT: 3.5 % (ref 3.0–12.0)
Monocytes Absolute: 0.2 10*3/uL (ref 0.1–1.0)
NEUTROS PCT: 70.2 % (ref 43.0–77.0)
Neutro Abs: 4.5 10*3/uL (ref 1.4–7.7)
Platelets: 226 10*3/uL (ref 150.0–400.0)
RBC: 5.15 Mil/uL (ref 4.22–5.81)
RDW: 12.6 % (ref 11.5–15.5)
WBC: 6.4 10*3/uL (ref 4.0–10.5)

## 2014-11-17 LAB — HIV ANTIBODY (ROUTINE TESTING W REFLEX): HIV 1&2 Ab, 4th Generation: NONREACTIVE

## 2014-11-18 LAB — URINALYSIS, ROUTINE W REFLEX MICROSCOPIC
BILIRUBIN URINE: NEGATIVE
Hgb urine dipstick: NEGATIVE
KETONES UR: NEGATIVE
Leukocytes, UA: NEGATIVE
Nitrite: NEGATIVE
PH: 5 (ref 5.0–8.0)
RBC / HPF: NONE SEEN (ref 0–?)
Total Protein, Urine: NEGATIVE
URINE GLUCOSE: NEGATIVE
Urobilinogen, UA: 0.2 — AB (ref 0.0–1.0)
WBC, UA: NONE SEEN (ref 0–?)

## 2014-11-18 LAB — HEPATIC FUNCTION PANEL
ALK PHOS: 77 U/L (ref 39–117)
ALT: 28 U/L (ref 0–53)
AST: 24 U/L (ref 0–37)
Albumin: 4.2 g/dL (ref 3.5–5.2)
BILIRUBIN DIRECT: 0.2 mg/dL (ref 0.0–0.3)
Total Bilirubin: 1.3 mg/dL — ABNORMAL HIGH (ref 0.2–1.2)
Total Protein: 7 g/dL (ref 6.0–8.3)

## 2014-11-18 LAB — BASIC METABOLIC PANEL
BUN: 9 mg/dL (ref 6–23)
CHLORIDE: 104 meq/L (ref 96–112)
CO2: 30 mEq/L (ref 19–32)
Calcium: 9.2 mg/dL (ref 8.4–10.5)
Creatinine, Ser: 1.1 mg/dL (ref 0.4–1.5)
GFR: 91.01 mL/min (ref >60.00)
GLUCOSE: 96 mg/dL (ref 70–99)
POTASSIUM: 4 meq/L (ref 3.5–5.1)
Sodium: 139 mEq/L (ref 135–145)

## 2014-11-18 LAB — LIPID PANEL
CHOLESTEROL: 163 mg/dL (ref 0–200)
HDL: 44.6 mg/dL (ref >39.00)
LDL Cholesterol: 103 mg/dL — ABNORMAL HIGH (ref 0–99)
NonHDL: 118.4
TRIGLYCERIDES: 76 mg/dL (ref 0.0–149.0)
Total CHOL/HDL Ratio: 4
VLDL: 15.2 mg/dL (ref 0.0–40.0)

## 2014-11-18 LAB — TSH: TSH: 1.32 u[IU]/mL (ref 0.35–4.50)

## 2014-11-18 LAB — PSA: PSA: 1.95 ng/mL (ref 0.10–4.00)

## 2014-11-20 ENCOUNTER — Encounter: Payer: Self-pay | Admitting: Family

## 2014-11-20 ENCOUNTER — Telehealth: Payer: Self-pay | Admitting: Family

## 2014-11-20 DIAGNOSIS — G4730 Sleep apnea, unspecified: Secondary | ICD-10-CM

## 2014-11-20 NOTE — Telephone Encounter (Signed)
Could you please contact pt's DME company and request that they change out CPAP face mask for nasal mask with chin strap- same settings?  thanks

## 2014-11-21 ENCOUNTER — Encounter (HOSPITAL_COMMUNITY): Payer: BC Managed Care – PPO

## 2014-11-22 NOTE — Telephone Encounter (Signed)
Patient gets supplies from [facility]. Order entered and printed, placed with fax cover sheet for Cody's signature.

## 2014-11-23 ENCOUNTER — Telehealth: Payer: Self-pay | Admitting: Family

## 2014-11-23 NOTE — Telephone Encounter (Signed)
Could not read any of the fax your sent them on Friday.  Please re fax

## 2014-11-26 NOTE — Telephone Encounter (Signed)
Resent order.

## 2014-11-27 ENCOUNTER — Telehealth: Payer: Self-pay

## 2014-11-27 NOTE — Telephone Encounter (Signed)
Crystal with [facility] [employer] called and needs sleep study, demographics, insurance information.  All information faxed to [phone]

## 2014-12-21 ENCOUNTER — Encounter (HOSPITAL_COMMUNITY): Payer: BC Managed Care – PPO

## 2015-01-03 ENCOUNTER — Telehealth (HOSPITAL_COMMUNITY): Payer: Self-pay

## 2015-01-03 NOTE — Telephone Encounter (Signed)
Encounter complete. 

## 2015-01-07 ENCOUNTER — Telehealth (HOSPITAL_COMMUNITY): Payer: Self-pay | Admitting: *Deleted

## 2015-01-08 ENCOUNTER — Encounter (HOSPITAL_COMMUNITY): Payer: BLUE CROSS/BLUE SHIELD

## 2015-01-23 ENCOUNTER — Telehealth (HOSPITAL_COMMUNITY): Payer: Self-pay

## 2015-01-23 NOTE — Telephone Encounter (Signed)
Encounter complete. 

## 2015-01-24 ENCOUNTER — Telehealth (HOSPITAL_COMMUNITY): Payer: Self-pay

## 2015-01-24 NOTE — Telephone Encounter (Signed)
Encounter complete. 

## 2015-01-25 ENCOUNTER — Ambulatory Visit (HOSPITAL_COMMUNITY)
Admission: RE | Admit: 2015-01-25 | Discharge: 2015-01-25 | Disposition: A | Discharge status: Home / Self Care | Payer: BLUE CROSS/BLUE SHIELD | Source: Ambulatory Visit | Attending: Cardiovascular Disease | Admitting: Cardiovascular Disease

## 2015-01-25 DIAGNOSIS — R0789 Other chest pain: Secondary | ICD-10-CM | POA: Insufficient documentation

## 2015-01-25 NOTE — Procedures (Signed)
Exercise Treadmill Test  Pre-Exercise Testing Evaluation Rhythm: normal sinus                  Test  Exercise Tolerance Test Ordering MD: Debbrah Alar, NP    Unique Test No: 1  Treadmill:  1  Indication for ETT: chest pain - rule out ischemia  Contraindication to ETT: No   Stress Modality: exercise - treadmill  Cardiac Imaging Performed: non   Protocol: standard Bruce - maximal  Max BP:  159/118  Max MPHR (bpm):  168 85% MPR (bpm):  142  MPHR obtained (bpm):  110 % MPHR obtained:  65  Reached 85% MPHR (min:sec):  0 Total Exercise Time (min-sec):  2:22  Workload in METS:  4.6 Borg Scale:   Reason ETT Terminated:  exaggerated hypertensive response Diastolic greater than 655   ST Segment Analysis At Rest: normal ST segments - no evidence of significant ST depression With Exercise: no evidence of significant ST depression  Other Information Arrhythmia:  No Angina during ETT:  absent (0) Quality of ETT:  non-diagnostic  ETT Interpretation:  normal - no evidence of ischemia by ST analysis  Comments: Non diagnostic because was unable to reach target HR secondary to elevation in BP  Recommendations: Pharmacologic myoview stress test

## 2015-01-27 ENCOUNTER — Telehealth: Payer: Self-pay | Admitting: Family

## 2015-01-27 DIAGNOSIS — R0789 Other chest pain: Secondary | ICD-10-CM

## 2015-01-27 NOTE — Telephone Encounter (Signed)
Stress test looked good but was incomplete as they could not get his heart rate up due to his elevated BP.  They recommend pharmacologic stress test. Order has been placed.

## 2015-01-28 NOTE — Telephone Encounter (Signed)
Notified pt and he is agreeable to proceed with additional test.

## 2015-01-30 ENCOUNTER — Encounter: Payer: Self-pay | Admitting: Family

## 2015-02-11 ENCOUNTER — Encounter (HOSPITAL_COMMUNITY): Payer: BLUE CROSS/BLUE SHIELD

## 2015-02-15 ENCOUNTER — Telehealth: Payer: Self-pay | Admitting: *Deleted

## 2015-02-15 ENCOUNTER — Ambulatory Visit (INDEPENDENT_AMBULATORY_CARE_PROVIDER_SITE_OTHER): Payer: BLUE CROSS/BLUE SHIELD | Admitting: Family

## 2015-02-15 ENCOUNTER — Encounter: Payer: Self-pay | Admitting: Family

## 2015-02-15 VITALS — BP 124/90 | HR 71 | Temp 98.1°F | Resp 16 | Ht 72.0 in | Wt 189.2 lb

## 2015-02-15 DIAGNOSIS — R0789 Other chest pain: Secondary | ICD-10-CM

## 2015-02-15 DIAGNOSIS — M25522 Pain in left elbow: Secondary | ICD-10-CM

## 2015-02-15 DIAGNOSIS — IMO0001 Reserved for inherently not codable concepts without codable children: Secondary | ICD-10-CM

## 2015-02-15 DIAGNOSIS — G4733 Obstructive sleep apnea (adult) (pediatric): Secondary | ICD-10-CM

## 2015-02-15 DIAGNOSIS — R030 Elevated blood-pressure reading, without diagnosis of hypertension: Secondary | ICD-10-CM

## 2015-02-15 NOTE — Progress Notes (Signed)
Pre visit review using our clinic review tool, if applicable. No additional management support is needed unless otherwise documented below in the visit note. 

## 2015-02-15 NOTE — Patient Instructions (Signed)
Please let us know if you don't hear back from [facility] in 1 week about your CPAP. Start meloxicam for elbow pain- let us know if your pain worsens or does not improve. Continue low sodium diet and exercise. Please schedule a follow up appointment in 6 months.

## 2015-02-15 NOTE — Progress Notes (Signed)
   Subjective:    Patient ID: Francisco Carter, male    DOB: March 27, 1961, 54 y.o.   MRN: [id number]  HPI  Mr Francisco Carter is a 54 yr old male who presents today for cpx.  BP Readings from Last 3 Encounters:  02/15/15 124/90  11/16/14 130/100  11/10/13 120/96  wife checking bp 120's/80's at home. Working on low sodium diet.   He is scheduled for myoview on 3/11. Denies any recent chest pain.    Left elbow pain- never started the meloxicam, reports ongoing elbow pain.   OSA- wants to switch to a nasal mask.  Awaiting equipment  Review of Systems See HPI  Past Medical History  Diagnosis Date  . Arthritis   . Chest pain 04/08/2013    History   Social History  . Marital Status: Single    Spouse Name: N/A  . Number of Children: N/A  . Years of Education: N/A   Occupational History  . Not on file.   Social History Main Topics  . Smoking status: Never Smoker   . Smokeless tobacco: Never Used  . Alcohol Use: Yes     Comment: occationally  . Drug Use: No  . Sexual Activity: Not on file   Other Topics Concern  . 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             Past Surgical History  Procedure Laterality Date  . Rotator cuff repair      left    Family History  Problem Relation Age of Onset  . Cancer Mother     bone  . Hypertension Mother   . Heart disease Father   . Hypertension Father   . Colon cancer Neg Hx   . Esophageal cancer Neg Hx   . Rectal cancer Neg Hx   . Prostate cancer Neg Hx   . Lupus Sister   . COPD Brother   . Kidney disease Maternal Uncle     ?kidney failure    No Known Allergies  Current Outpatient Prescriptions on File Prior to Visit  Medication Sig Dispense Refill  . meloxicam (MOBIC) 7.5 MG tablet Take 1 tablet (7.5 mg total) by mouth daily. 14 tablet 0   No current facility-administered medications on file prior to  visit.    BP 124/90 mmHg  Pulse 71  Temp(Src) 98.1 F (36.7 C) (Oral)  Resp 16  Ht 6' (1.829 m)  Wt 189 lb 3.2 oz (85.821 kg)  BMI 25.65 kg/m2  SpO2 97%       Objective:   Physical Exam  Constitutional: He is oriented to person, place, and time. He appears well-developed and well-nourished. No distress.  HENT:  Head: Normocephalic and atraumatic.  Cardiovascular: Normal rate and regular rhythm.   No murmur heard. Pulmonary/Chest: Effort normal and breath sounds normal. No respiratory distress. He has no wheezes. He has no rales.  Musculoskeletal: He exhibits no edema.  Neurological: He is alert and oriented to person, place, and time.  Skin: Skin is warm and dry.  Psychiatric: He has a normal mood and affect. His behavior is normal. Thought content normal.          Assessment & Plan:

## 2015-02-15 NOTE — Telephone Encounter (Signed)
Pt in office today and states that he was never contacted after last visit (11/2014) about changing from full face mask to nasal mask with chin strap (CPAP). Spoke with Norvel Richards at [facility] and she states they did not receive order or additional info that was requested in 11/22/14 phone note. Last rx they have is from 2014. She will fax form to [place] to complete. She requests that we fax insurance info, order and today's office note to [phone]. Form received and forwarded to Provider for signature.

## 2015-02-17 MED ORDER — MELOXICAM 7.5 MG PO TABS: 7.5000 mg | ORAL_TABLET | Freq: Every day | ORAL | Status: DC

## 2015-02-17 NOTE — Assessment & Plan Note (Signed)
BP stable, continue low sodium diet.

## 2015-02-17 NOTE — Assessment & Plan Note (Signed)
We have re-initiated referral request to home dme for equipment.

## 2015-02-17 NOTE — Assessment & Plan Note (Signed)
Stable, advised pt to follow through with myoview.

## 2015-02-17 NOTE — Assessment & Plan Note (Signed)
Trial of meloxicam.

## 2015-02-19 NOTE — Telephone Encounter (Signed)
Order, insurance cards, demographics and 02/15/15 office note faxed to below #.

## 2015-02-22 ENCOUNTER — Encounter (HOSPITAL_COMMUNITY): Payer: BLUE CROSS/BLUE SHIELD

## 2015-03-08 ENCOUNTER — Ambulatory Visit (HOSPITAL_COMMUNITY): Payer: BLUE CROSS/BLUE SHIELD | Attending: Cardiology | Admitting: Radiology

## 2015-03-08 DIAGNOSIS — R0789 Other chest pain: Secondary | ICD-10-CM | POA: Diagnosis not present

## 2015-03-08 MED ORDER — REGADENOSON 0.4 MG/5ML IV SOLN
0.4000 mg | Freq: Once | INTRAVENOUS | Status: AC
  Administered 2015-03-08: 0.4 mg via INTRAVENOUS

## 2015-03-08 MED ORDER — TECHNETIUM TC 99M SESTAMIBI GENERIC - CARDIOLITE
11.0000 | Freq: Once | INTRAVENOUS | Status: AC | PRN
  Administered 2015-03-08: 11 via INTRAVENOUS

## 2015-03-08 MED ORDER — TECHNETIUM TC 99M SESTAMIBI GENERIC - CARDIOLITE
33.0000 | Freq: Once | INTRAVENOUS | Status: AC | PRN
Start: 2015-03-08 — End: 2015-03-08
  Administered 2015-03-08: 33 via INTRAVENOUS

## 2015-03-08 NOTE — Progress Notes (Signed)
[facility] [address] [place],  [address] [phone]    Cardiology Nuclear Med Study  Francisco Carter is a 54 y.o. male     MRN : [id number]     DOB: [birth year]  Procedure Date: 03/08/2015  Nuclear Med Background Indication for Stress Test:  Evaluation for Ischemia History:  unable  to completeTHR d/t ^bp Cardiac Risk Factors: No Known CAD History  Symptoms:  Chest Pain   Nuclear Pre-Procedure Caffeine/Decaff Intake:  None NPO After: 11:00pm   Lungs:  clear O2 Sat: 96% on room air. IV 0.9% NS with Angio Cath:  20g  IV Site: L Antecubital  IV Started by:  Ileene Hutchinson, EMT-P  Chest Size (in):  36 Cup Size: n/a  Height: 6' (1.829 m)  Weight:  198 lb (89.812 kg)  BMI:  Body mass index is 26.85 kg/(m^2). Tech Comments:  n/a    Nuclear Med Study 1 or 2 day study: 1 day  Stress Test Type:  Treadmill/Lexiscan  Reading MD: n/a  Order Authorizing Provider:  Dr.Blyth  Resting Radionuclide: Technetium 46m Sestamibi  Resting Radionuclide Dose: 11.0 mCi   Stress Radionuclide:  Technetium 77m Sestamibi  Stress Radionuclide Dose: 33.0 mCi           Stress Protocol Rest HR: 56 Stress HR: 111  Rest BP: 130/98 Stress BP: 135/88  Exercise Time (min): n/a METS: n/a   Predicted Max HR: 167 bpm % Max HR: 66.47 bpm Rate Pressure Product: 15762   Dose of Adenosine (mg):  n/a Dose of Lexiscan: 0.4 mg  Dose of Atropine (mg): n/a Dose of Dobutamine: n/a mcg/kg/min (at max HR)  Stress Test Technologist: Glade Lloyd, BS-ES  Nuclear Technologist:  Earl Many, CNMT     Rest Procedure:  Myocardial perfusion imaging was performed at rest 45 minutes following the intravenous administration of Technetium 76m Sestamibi. Rest ZJQ:BHALP bradycardia  56 bpm    Stress Procedure:  The patient received IV Lexiscan 0.4 mg over 15-seconds with concurrent low level exercise and then Technetium 9m Sestamibi was injected at 30-seconds while the patient  continued walking one more minute.  Quantitative spect images were obtained after a 45-minute delay.  During the infusion of Lexiscan the patient complained of fatigue that resolved in recovery.  Stress ECG: No significant change from baseline ECG  QPS Raw Data Images: Soft tissue (diaphragm, bowel activity) underlie hear.t  Stress Images: Mild thinning in the basal inferior wall (small)   Rest Images: No significant change from the stress images .   Subtraction (SDS):  No evidence of ischemia. Transient Ischemic Dilatation (Normal <1.22):  0.99 Lung/Heart Ratio (Normal <0.45):  0.23  Quantitative Gated Spect Images QGS EDV:  113 ml QGS ESV:  54 ml  Impression Exercise Capacity:  Lexiscan with low level exercise. BP Response:  Normal blood pressure response. Clinical Symptoms:  No symptoms. ECG Impression:  No significant ST segment change suggestive of ischemia. Comparison with Prior Nuclear Study: No prior nuclear study.    Overall Impression: Probable normal perfusion and mild soft tissue attneuation  No significant ischemia or scar   Low risk scan    LV Ejection Fraction: 53%.  LV Wall Motion:  Normal    Dorris Carnes

## 2016-01-20 ENCOUNTER — Telehealth: Payer: Self-pay | Admitting: Behavioral Health

## 2016-01-20 NOTE — Telephone Encounter (Signed)
Unable to reach patient at time of Pre-Visit Call.  Left message for patient to return call when available.    

## 2016-01-21 ENCOUNTER — Encounter: Payer: Self-pay | Admitting: Family

## 2016-01-22 ENCOUNTER — Telehealth: Payer: Self-pay | Admitting: Family

## 2016-01-22 ENCOUNTER — Encounter: Payer: Self-pay | Admitting: Family

## 2016-01-22 NOTE — Telephone Encounter (Signed)
Marked to charge and mailing no show letter °

## 2016-01-22 NOTE — Telephone Encounter (Signed)
Yes please

## 2016-01-22 NOTE — Telephone Encounter (Signed)
Pt was no show 01/21/16 9:00am for cpe, pt has not rescheduled, 1st no show w/in 12 months, charge or no charge?

## 2016-06-08 ENCOUNTER — Telehealth: Payer: Self-pay | Admitting: Family

## 2016-06-08 NOTE — Telephone Encounter (Signed)
Pt lvm at 8:56 stating that he will not be able to make his appt, he got his days off mixed up.

## 2016-06-08 NOTE — Telephone Encounter (Signed)
Please check with pt because his apt is not until 6/29, does he want to cancel that appointment?

## 2016-06-11 ENCOUNTER — Encounter: Payer: Self-pay | Admitting: Family

## 2016-07-24 ENCOUNTER — Encounter: Payer: Self-pay | Admitting: Family

## 2016-10-09 ENCOUNTER — Encounter: Payer: Self-pay | Admitting: Family

## 2016-10-09 DIAGNOSIS — Z0289 Encounter for other administrative examinations: Secondary | ICD-10-CM

## 2016-10-12 ENCOUNTER — Encounter: Payer: Self-pay | Admitting: Family

## 2016-12-25 ENCOUNTER — Encounter: Payer: Self-pay | Admitting: Family

## 2016-12-25 ENCOUNTER — Ambulatory Visit (INDEPENDENT_AMBULATORY_CARE_PROVIDER_SITE_OTHER): Payer: 59 | Admitting: Family

## 2016-12-25 VITALS — BP 130/89 | HR 67 | Temp 98.2°F | Resp 16 | Ht 72.0 in | Wt 187.8 lb

## 2016-12-25 DIAGNOSIS — Z0000 Encounter for general adult medical examination without abnormal findings: Secondary | ICD-10-CM

## 2016-12-25 DIAGNOSIS — K0889 Other specified disorders of teeth and supporting structures: Secondary | ICD-10-CM | POA: Diagnosis not present

## 2016-12-25 DIAGNOSIS — B079 Viral wart, unspecified: Secondary | ICD-10-CM

## 2016-12-25 DIAGNOSIS — Z23 Encounter for immunization: Secondary | ICD-10-CM | POA: Diagnosis not present

## 2016-12-25 LAB — BASIC METABOLIC PANEL
BUN: 11 mg/dL (ref 6–23)
CHLORIDE: 106 meq/L (ref 96–112)
CO2: 32 meq/L (ref 19–32)
Calcium: 9.5 mg/dL (ref 8.4–10.5)
Creatinine, Ser: 1.18 mg/dL (ref 0.40–1.50)
GFR: 82.4 mL/min (ref >60.00)
Glucose, Bld: 81 mg/dL (ref 70–99)
Potassium: 4.1 mEq/L (ref 3.5–5.1)
SODIUM: 142 meq/L (ref 135–145)

## 2016-12-25 LAB — URINALYSIS, ROUTINE W REFLEX MICROSCOPIC
BILIRUBIN URINE: NEGATIVE
Hgb urine dipstick: NEGATIVE
KETONES UR: NEGATIVE
Leukocytes, UA: NEGATIVE
Nitrite: NEGATIVE
PH: 6 (ref 5.0–8.0)
RBC / HPF: NONE SEEN (ref 0–?)
SPECIFIC GRAVITY, URINE: 1.02 (ref 1.000–1.030)
TOTAL PROTEIN, URINE-UPE24: NEGATIVE
UROBILINOGEN UA: 0.2 (ref 0.0–1.0)
Urine Glucose: NEGATIVE

## 2016-12-25 LAB — CBC WITH DIFFERENTIAL/PLATELET
BASOS PCT: 0.5 % (ref 0.0–3.0)
Basophils Absolute: 0 10*3/uL (ref 0.0–0.1)
EOS PCT: 2.9 % (ref 0.0–5.0)
Eosinophils Absolute: 0.2 10*3/uL (ref 0.0–0.7)
HEMATOCRIT: 42.3 % (ref 39.0–52.0)
HEMOGLOBIN: 14.2 g/dL (ref 13.0–17.0)
LYMPHS PCT: 23.9 % (ref 12.0–46.0)
Lymphs Abs: 1.4 10*3/uL (ref 0.7–4.0)
MCHC: 33.6 g/dL (ref 30.0–36.0)
MCV: 88.1 fl (ref 78.0–100.0)
MONOS PCT: 5.7 % (ref 3.0–12.0)
Monocytes Absolute: 0.3 10*3/uL (ref 0.1–1.0)
Neutro Abs: 3.9 10*3/uL (ref 1.4–7.7)
Neutrophils Relative %: 67 % (ref 43.0–77.0)
Platelets: 268 10*3/uL (ref 150.0–400.0)
RBC: 4.8 Mil/uL (ref 4.22–5.81)
RDW: 12.4 % (ref 11.5–15.5)
WBC: 5.9 10*3/uL (ref 4.0–10.5)

## 2016-12-25 LAB — LIPID PANEL
CHOL/HDL RATIO: 4
Cholesterol: 147 mg/dL (ref 0–200)
HDL: 40.6 mg/dL (ref >39.00)
LDL Cholesterol: 85 mg/dL (ref 0–99)
NonHDL: 106.8
Triglycerides: 111 mg/dL (ref 0.0–149.0)
VLDL: 22.2 mg/dL (ref 0.0–40.0)

## 2016-12-25 LAB — HEPATIC FUNCTION PANEL
ALBUMIN: 4 g/dL (ref 3.5–5.2)
ALT: 24 U/L (ref 0–53)
AST: 21 U/L (ref 0–37)
Alkaline Phosphatase: 79 U/L (ref 39–117)
BILIRUBIN DIRECT: 0.1 mg/dL (ref 0.0–0.3)
TOTAL PROTEIN: 6.5 g/dL (ref 6.0–8.3)
Total Bilirubin: 0.5 mg/dL (ref 0.2–1.2)

## 2016-12-25 LAB — PSA: PSA: 1.24 ng/mL (ref 0.10–4.00)

## 2016-12-25 LAB — TSH: TSH: 1.11 u[IU]/mL (ref 0.35–4.50)

## 2016-12-25 MED ORDER — AMOXICILLIN-POT CLAVULANATE 875-125 MG PO TABS: 1.0000 | ORAL_TABLET | Freq: Two times a day (BID) | ORAL | 0 refills | Status: DC

## 2016-12-25 NOTE — Patient Instructions (Signed)
Please complete lab work prior to leaving. Follow up in 1 year, sooner if problems/concern.  Please schedule an appointment with a dentist at your earliest convenience.

## 2016-12-25 NOTE — Progress Notes (Signed)
Pre visit review using our clinic review tool, if applicable. No additional management support is needed unless otherwise documented below in the visit note. 

## 2016-12-25 NOTE — Progress Notes (Signed)
Subjective:    Patient ID: Francisco Carter, male    DOB: March 20, 1961, 56 y.o.   MRN: [id number]  HPI  Mr. Francisco Carter is a 56 yr old male who presents today for cpx.  Patient presents today for complete physical.  Immunizations: Tetanus up to date, due for flu shot. Diet: trying to eat healthy Exercise: reports that he is "exercising a lot" at work.  Colonoscopy: 2013- due for follow up colo 2023. Dental:  Plans to see a dentist.    Wt Readings from Last 3 Encounters:  12/25/16 187 lb 12.8 oz (85.2 kg)  03/08/15 198 lb (89.8 kg)  02/15/15 189 lb 3.2 oz (85.8 kg)   notes some bilateral "toothaches."    Review of Systems  Constitutional: Negative for unexpected weight change.  HENT: Negative for hearing loss.        Has some toothache on the left upper   Eyes: Negative for visual disturbance.  Respiratory: Negative for shortness of breath.   Cardiovascular: Negative for leg swelling.  Gastrointestinal: Negative for blood in stool, constipation and diarrhea.  Genitourinary: Negative for dysuria and frequency.  Musculoskeletal: Negative for arthralgias and myalgias.       Mild low back pain  Neurological: Negative for headaches.  Hematological: Negative for adenopathy.  Psychiatric/Behavioral:       Denies depression/anxiety   Past Medical History:  Diagnosis Date  . Arthritis   . Chest pain 04/08/2013     Social History   Social History  . Marital status: Single    Spouse name: N/A  . Number of children: N/A  . Years of education: N/A   Occupational History  . Not on file.   Social History Main Topics  . Smoking status: Never Smoker  . Smokeless tobacco: Never Used  . Alcohol use Yes     Comment: occationally  . Drug use: No  . Sexual activity: Not on file   Other Topics Concern  . 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             Past  Surgical History:  Procedure Laterality Date  . ROTATOR CUFF REPAIR     left    Family History  Problem Relation Age of Onset  . Cancer Mother     bone  . Hypertension Mother   . Heart disease Father   . Hypertension Father   . Lupus Sister   . COPD Brother   . Kidney disease Maternal Uncle     ?kidney failure  . Colon cancer Neg Hx   . Esophageal cancer Neg Hx   . Rectal cancer Neg Hx   . Prostate cancer Neg Hx     No Known Allergies  Current Outpatient Prescriptions on File Prior to Visit  Medication Sig Dispense Refill  . meloxicam (MOBIC) 7.5 MG tablet Take 1 tablet (7.5 mg total) by mouth daily. 14 tablet 0   No current facility-administered medications on file prior to visit.     BP 130/89 (BP Location: Right Arm, Cuff Size: Large)   Pulse 67   Temp 98.2 F (36.8 C) (Oral)   Resp 16   Ht 6' (1.829 m)   Wt 187 lb 12.8 oz (85.2 kg)   SpO2 100% Comment: room air  BMI 25.47 kg/m       Objective:   Physical  Exam Physical Exam  Constitutional: He is oriented to person, place, and time. He appears well-developed and well-nourished. No distress.  HENT:  Head: Normocephalic and atraumatic.  Right Ear: Tympanic membrane and ear canal normal.  Left Ear: Tympanic membrane and ear canal normal.  Mouth/Throat: Oropharynx is clear and moist.  Eyes: Pupils are equal, round, and reactive to light. No scleral icterus.  Neck: Normal range of motion. No thyromegaly present.  Cardiovascular: Normal rate and regular rhythm.   No murmur heard. Pulmonary/Chest: Effort normal and breath sounds normal. No respiratory distress. He has no wheezes. He has no rales. He exhibits no tenderness.  Abdominal: Soft. Bowel sounds are normal. He exhibits no distension and no mass. There is no tenderness. There is no rebound and no guarding.  Musculoskeletal: He exhibits no edema.  Lymphadenopathy:    He has no cervical adenopathy.  Neurological: He is alert and oriented to person,  place, and time. He has normal patellar reflexes. He exhibits normal muscle tone. Coordination normal.  Skin: Skin is warm and dry. multiple filiform warts noted- right neck, left forehead x 2, left cheek Psychiatric: He has a normal mood and affect. His behavior is normal. Judgment and thought content normal.           Assessment & Plan:          Assessment & Plan:  EKG tracing is personally reviewed.  EKG notes NSR.  No acute changes.   Dental pain- likely early abscess- will begin augmentin and advised pt to schedule an appointment with dental asap.  Warts- 4 warts frozen using liquid nitrogen after verbal consent was obtained. Pt tolerated procedure without difficulty

## 2016-12-25 NOTE — Assessment & Plan Note (Signed)
Discussed healthy diet, adding aerobic exercise. Obtain routine lab work. Flu shot today.

## 2016-12-27 ENCOUNTER — Encounter: Payer: Self-pay | Admitting: Family

## 2018-02-07 ENCOUNTER — Ambulatory Visit (INDEPENDENT_AMBULATORY_CARE_PROVIDER_SITE_OTHER): Payer: Self-pay | Admitting: Family

## 2018-02-07 DIAGNOSIS — Z0000 Encounter for general adult medical examination without abnormal findings: Secondary | ICD-10-CM

## 2018-02-08 NOTE — Progress Notes (Signed)
Pt wished to reschedule because his insurance was not active.

## 2018-11-16 ENCOUNTER — Encounter: Payer: Self-pay | Admitting: Family

## 2018-11-16 ENCOUNTER — Ambulatory Visit (INDEPENDENT_AMBULATORY_CARE_PROVIDER_SITE_OTHER): Payer: Self-pay | Admitting: Family

## 2018-11-16 VITALS — BP 145/94 | HR 69 | Temp 98.3°F | Ht 69.0 in | Wt 199.8 lb

## 2018-11-16 DIAGNOSIS — I10 Essential (primary) hypertension: Secondary | ICD-10-CM

## 2018-11-16 DIAGNOSIS — Z0000 Encounter for general adult medical examination without abnormal findings: Secondary | ICD-10-CM

## 2018-11-16 DIAGNOSIS — Z23 Encounter for immunization: Secondary | ICD-10-CM

## 2018-11-16 LAB — CBC WITH DIFFERENTIAL/PLATELET
BASOS PCT: 0.6 % (ref 0.0–3.0)
Basophils Absolute: 0 10*3/uL (ref 0.0–0.1)
Eosinophils Absolute: 0.1 10*3/uL (ref 0.0–0.7)
Eosinophils Relative: 2.4 % (ref 0.0–5.0)
HCT: 42.7 % (ref 39.0–52.0)
HEMOGLOBIN: 14.4 g/dL (ref 13.0–17.0)
Lymphocytes Relative: 25.4 % (ref 12.0–46.0)
Lymphs Abs: 1.5 10*3/uL (ref 0.7–4.0)
MCHC: 33.8 g/dL (ref 30.0–36.0)
MCV: 88.3 fl (ref 78.0–100.0)
MONOS PCT: 7.9 % (ref 3.0–12.0)
Monocytes Absolute: 0.5 10*3/uL (ref 0.1–1.0)
Neutro Abs: 3.8 10*3/uL (ref 1.4–7.7)
Neutrophils Relative %: 63.7 % (ref 43.0–77.0)
Platelets: 207 10*3/uL (ref 150.0–400.0)
RBC: 4.84 Mil/uL (ref 4.22–5.81)
RDW: 12.9 % (ref 11.5–15.5)
WBC: 5.9 10*3/uL (ref 4.0–10.5)

## 2018-11-16 LAB — BASIC METABOLIC PANEL
BUN: 16 mg/dL (ref 6–23)
CHLORIDE: 107 meq/L (ref 96–112)
CO2: 28 mEq/L (ref 19–32)
Calcium: 9.3 mg/dL (ref 8.4–10.5)
Creatinine, Ser: 1.18 mg/dL (ref 0.40–1.50)
GFR: 81.83 mL/min (ref >60.00)
GLUCOSE: 100 mg/dL — AB (ref 70–99)
POTASSIUM: 4.8 meq/L (ref 3.5–5.1)
SODIUM: 141 meq/L (ref 135–145)

## 2018-11-16 LAB — URINALYSIS, ROUTINE W REFLEX MICROSCOPIC
BILIRUBIN URINE: NEGATIVE
Hgb urine dipstick: NEGATIVE
KETONES UR: NEGATIVE
LEUKOCYTES UA: NEGATIVE
Nitrite: NEGATIVE
PH: 5 (ref 5.0–8.0)
RBC / HPF: NONE SEEN (ref 0–?)
TOTAL PROTEIN, URINE-UPE24: NEGATIVE
URINE GLUCOSE: NEGATIVE
UROBILINOGEN UA: 0.2 (ref 0.0–1.0)

## 2018-11-16 LAB — HEPATIC FUNCTION PANEL
ALBUMIN: 4.2 g/dL (ref 3.5–5.2)
ALT: 32 U/L (ref 0–53)
AST: 27 U/L (ref 0–37)
Alkaline Phosphatase: 94 U/L (ref 39–117)
Bilirubin, Direct: 0.1 mg/dL (ref 0.0–0.3)
TOTAL PROTEIN: 6.6 g/dL (ref 6.0–8.3)
Total Bilirubin: 0.7 mg/dL (ref 0.2–1.2)

## 2018-11-16 LAB — LIPID PANEL
CHOL/HDL RATIO: 3
CHOLESTEROL: 145 mg/dL (ref 0–200)
HDL: 41.7 mg/dL (ref >39.00)
LDL Cholesterol: 92 mg/dL (ref 0–99)
NONHDL: 103.25
TRIGLYCERIDES: 55 mg/dL (ref 0.0–149.0)
VLDL: 11 mg/dL (ref 0.0–40.0)

## 2018-11-16 LAB — TSH: TSH: 1.94 u[IU]/mL (ref 0.35–4.50)

## 2018-11-16 LAB — PSA: PSA: 3.59 ng/mL (ref 0.10–4.00)

## 2018-11-16 MED ORDER — AMLODIPINE BESYLATE 2.5 MG PO TABS: 2.5000 mg | ORAL_TABLET | Freq: Every day | ORAL | 2 refills | Status: DC

## 2018-11-16 NOTE — Patient Instructions (Addendum)
Please schedule a routine eye exam. Continue to work on healthy diet, exercise and weight loss.  Please begin amlodipine once daily for blood pressure.

## 2018-11-16 NOTE — Progress Notes (Addendum)
Subjective:     Patient ID: Francisco Carter, male   DOB: [birth year], 57 y.o.   MRN: [id number]  HPI  Patient presents today for complete physical.  Immunizations: tdap 2012, Due for flu shot and shingrix today. Diet: reports that he has been trying to eat healthier Wt Readings from Last 3 Encounters:  11/16/18 199 lb 12.8 oz (90.6 kg)  12/25/16 187 lb 12.8 oz (85.2 kg)  03/08/15 198 lb (89.8 kg)  Exercise: walks Colonoscopy: 03/04/12 Vision: due Dental:  Up to date   Review of Systems  Constitutional: Positive for unexpected weight change.  HENT: Negative for hearing loss and rhinorrhea.   Eyes: Negative for visual disturbance.  Respiratory: Negative for cough.   Cardiovascular: Negative for leg swelling.  Gastrointestinal: Negative for blood in stool, constipation and diarrhea.  Genitourinary: Negative for difficulty urinating, dysuria, frequency and hematuria.  Musculoskeletal: Negative for arthralgias and myalgias.  Skin: Negative for rash.  Neurological:       2-3 mild headaches/week  Hematological: Negative for adenopathy.  Psychiatric/Behavioral:       Denies depression/anxiety   Past Medical History:  Diagnosis Date  . Arthritis   . Chest pain 04/08/2013     Social History   Socioeconomic History  . Marital status: Single    Spouse name: Not on file  . Number of children: Not on file  . Years of education: Not on file  . Highest education level: Not on file  Occupational History  . Not on file  Social Needs  . Financial resource strain: Not on file  . Food insecurity:    Worry: Not on file    Inability: Not on file  . Transportation needs:    Medical: Not on file    Non-medical: Not on file  Tobacco Use  . Smoking status: Never Smoker  . Smokeless tobacco: Never Used  Substance and Sexual Activity  . Alcohol use: Yes    Comment: occationally  . Drug use: No  . Sexual activity: Not on file  Lifestyle  . Physical activity:    Days per week:  Not on file    Minutes per session: Not on file  . Stress: Not on file  Relationships  . Social connections:    Talks on phone: Not on file    Gets together: Not on file    Attends religious service: Not on file    Active member of club or organization: Not on file    Attends meetings of clubs or organizations: Not on file    Relationship status: Not on file  . Intimate partner violence:    Fear of current or ex partner: Not on file    Emotionally abused: Not on file    Physically abused: Not on file    Forced sexual activity: Not on file  Other Topics Concern  . 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          Past Surgical History:  Procedure Laterality Date  . ROTATOR CUFF REPAIR     left    Family History  Problem Relation Age of Onset  . Cancer Mother        bone  . Hypertension Mother   . Heart disease Father   . Hypertension Father   . Lupus Sister   . COPD Brother   .  Kidney disease Maternal Uncle        ?kidney failure  . Colon cancer Neg Hx   . Esophageal cancer Neg Hx   . Rectal cancer Neg Hx   . Prostate cancer Neg Hx     No Known Allergies  No current outpatient medications on file prior to visit.   No current facility-administered medications on file prior to visit.     BP (!) 145/94   Pulse 69   Temp 98.3 F (36.8 C) (Oral)   Ht 5\' 9"  (1.753 m)   Wt 199 lb 12.8 oz (90.6 kg)   SpO2 100%   BMI 29.51 kg/m       Objective:   Physical Exam  Physical Exam  Constitutional: He is oriented to person, place, and time. He appears well-developed and well-nourished. No distress.  HENT:  Head: Normocephalic and atraumatic.  Right Ear: Tympanic membrane and ear canal normal.  Left Ear: Tympanic membrane and ear canal normal.  Mouth/Throat: Oropharynx is clear and moist.  Eyes: Pupils are equal, round, and reactive to light. No scleral  icterus.  Neck: Normal range of motion. No thyromegaly present.  Cardiovascular: Normal rate and regular rhythm.   No murmur heard. Pulmonary/Chest: Effort normal and breath sounds normal. No respiratory distress. He has no wheezes. He has no rales. He exhibits no tenderness.  Abdominal: Soft. Bowel sounds are normal. He exhibits no distension and no mass. There is no tenderness. There is no rebound and no guarding.  Musculoskeletal: He exhibits no edema.  Lymphadenopathy:    He has no cervical adenopathy.  Neurological: He is alert and oriented to person, place, and time. He has normal patellar reflexes. He exhibits normal muscle tone. Coordination normal.  Skin: Skin is warm and dry. Several wart- like growths on face- left forehead, right cheek, right upper anterior neck Psychiatric: He has a normal mood and affect. His behavior is normal. Judgment and thought content normal.           Assessment & Plan:       Assessment:    Preventative care- discussed healthy diet, exercise today. Flu shot and Shingrix #1 today.  Obtain routine lab work including PSA. Colo up to date. EKG tracing is personally reviewed.  EKG notes NSR.  Appears unchanged compared to previous EKG. No acute changes.  Plan:     HTN-  bp elevated. Discussed low sodium diet. Will initiate amlodipine 2.5mg  once daily.   BP Readings from Last 3 Encounters:  11/16/18 (!) 145/94  12/25/16 130/89  03/08/15 (!) 130/98

## 2018-11-17 ENCOUNTER — Encounter: Payer: Self-pay | Admitting: Family

## 2018-12-19 ENCOUNTER — Ambulatory Visit: Payer: Self-pay | Admitting: Family

## 2018-12-19 DIAGNOSIS — Z0289 Encounter for other administrative examinations: Secondary | ICD-10-CM

## 2019-01-17 ENCOUNTER — Ambulatory Visit: Payer: Self-pay

## 2019-02-14 ENCOUNTER — Telehealth: Payer: Self-pay | Admitting: *Deleted

## 2019-02-14 NOTE — Telephone Encounter (Signed)
Pt due for 2nd Shingrix. He is also past due for 1 month follow up with Melissa. Left detailed message on voicemail for pt to call and schedule OV with Melissa and we can do 2nd vaccine at that visit as well. Also mailed letter to pt. Ok for [facility] to discuss / schedule for pt.

## 2019-12-05 ENCOUNTER — Other Ambulatory Visit: Payer: Self-pay

## 2019-12-06 ENCOUNTER — Other Ambulatory Visit: Payer: PRIVATE HEALTH INSURANCE

## 2019-12-06 ENCOUNTER — Other Ambulatory Visit: Payer: Self-pay

## 2019-12-06 ENCOUNTER — Ambulatory Visit (INDEPENDENT_AMBULATORY_CARE_PROVIDER_SITE_OTHER): Payer: PRIVATE HEALTH INSURANCE | Admitting: Family

## 2019-12-06 ENCOUNTER — Encounter: Payer: Self-pay | Admitting: Family

## 2019-12-06 VITALS — BP 142/95 | HR 87 | Temp 97.8°F | Resp 16 | Ht 69.0 in | Wt 200.0 lb

## 2019-12-06 DIAGNOSIS — I10 Essential (primary) hypertension: Secondary | ICD-10-CM | POA: Diagnosis not present

## 2019-12-06 DIAGNOSIS — R110 Nausea: Secondary | ICD-10-CM | POA: Diagnosis not present

## 2019-12-06 DIAGNOSIS — Z23 Encounter for immunization: Secondary | ICD-10-CM

## 2019-12-06 DIAGNOSIS — Z125 Encounter for screening for malignant neoplasm of prostate: Secondary | ICD-10-CM | POA: Diagnosis not present

## 2019-12-06 DIAGNOSIS — G4733 Obstructive sleep apnea (adult) (pediatric): Secondary | ICD-10-CM | POA: Diagnosis not present

## 2019-12-06 DIAGNOSIS — L729 Follicular cyst of the skin and subcutaneous tissue, unspecified: Secondary | ICD-10-CM

## 2019-12-06 DIAGNOSIS — Z0000 Encounter for general adult medical examination without abnormal findings: Secondary | ICD-10-CM

## 2019-12-06 LAB — HEPATIC FUNCTION PANEL
ALT: 21 U/L (ref 0–53)
AST: 19 U/L (ref 0–37)
Albumin: 4.4 g/dL (ref 3.5–5.2)
Alkaline Phosphatase: 95 U/L (ref 39–117)
Bilirubin, Direct: 0.2 mg/dL (ref 0.0–0.3)
Total Bilirubin: 0.8 mg/dL (ref 0.2–1.2)
Total Protein: 6.9 g/dL (ref 6.0–8.3)

## 2019-12-06 LAB — CBC WITH DIFFERENTIAL/PLATELET
Basophils Absolute: 0 10*3/uL (ref 0.0–0.1)
Basophils Relative: 0.7 % (ref 0.0–3.0)
Eosinophils Absolute: 0.2 10*3/uL (ref 0.0–0.7)
Eosinophils Relative: 2.6 % (ref 0.0–5.0)
HCT: 44.2 % (ref 39.0–52.0)
Hemoglobin: 14.7 g/dL (ref 13.0–17.0)
Lymphocytes Relative: 24.2 % (ref 12.0–46.0)
Lymphs Abs: 1.4 10*3/uL (ref 0.7–4.0)
MCHC: 33.3 g/dL (ref 30.0–36.0)
MCV: 88.6 fl (ref 78.0–100.0)
Monocytes Absolute: 0.5 10*3/uL (ref 0.1–1.0)
Monocytes Relative: 7.6 % (ref 3.0–12.0)
Neutro Abs: 3.9 10*3/uL (ref 1.4–7.7)
Neutrophils Relative %: 64.9 % (ref 43.0–77.0)
Platelets: 252 10*3/uL (ref 150.0–400.0)
RBC: 4.99 Mil/uL (ref 4.22–5.81)
RDW: 12.4 % (ref 11.5–15.5)
WBC: 6 10*3/uL (ref 4.0–10.5)

## 2019-12-06 LAB — LIPID PANEL
Cholesterol: 175 mg/dL (ref 0–200)
HDL: 42.7 mg/dL (ref >39.00)
LDL Cholesterol: 112 mg/dL — ABNORMAL HIGH (ref 0–99)
NonHDL: 132.42
Total CHOL/HDL Ratio: 4
Triglycerides: 100 mg/dL (ref 0.0–149.0)
VLDL: 20 mg/dL (ref 0.0–40.0)

## 2019-12-06 LAB — BASIC METABOLIC PANEL
BUN: 14 mg/dL (ref 6–23)
CO2: 28 mEq/L (ref 19–32)
Calcium: 9.6 mg/dL (ref 8.4–10.5)
Chloride: 104 mEq/L (ref 96–112)
Creatinine, Ser: 1.06 mg/dL (ref 0.40–1.50)
GFR: 86.81 mL/min (ref >60.00)
Glucose, Bld: 108 mg/dL — ABNORMAL HIGH (ref 70–99)
Potassium: 4.3 mEq/L (ref 3.5–5.1)
Sodium: 139 mEq/L (ref 135–145)

## 2019-12-06 LAB — PSA: PSA: 1.67 ng/mL (ref 0.10–4.00)

## 2019-12-06 LAB — TSH: TSH: 1.57 u[IU]/mL (ref 0.35–4.50)

## 2019-12-06 MED ORDER — AMLODIPINE BESYLATE 2.5 MG PO TABS: 2.5000 mg | ORAL_TABLET | Freq: Every day | ORAL | 1 refills | Status: DC

## 2019-12-06 MED ORDER — OMEPRAZOLE 40 MG PO CPDR: 40.0000 mg | DELAYED_RELEASE_CAPSULE | Freq: Every day | ORAL | 3 refills | Status: DC

## 2019-12-06 NOTE — Patient Instructions (Signed)
Please complete lab work prior to leaving. Start omeprazole for your stomach discomfort.  Restart amlodipine 2.5mg  once daily for blood pressure. You should be contacted about scheduling your appointment with the sleep specialist.

## 2019-12-06 NOTE — Addendum Note (Signed)
Addended by: Caffie Pinto on: 12/06/2019 01:51 PM   Modules accepted: Orders

## 2019-12-06 NOTE — Progress Notes (Signed)
Subjective:    Patient ID: Francisco Carter, male    DOB: [birth year], 58 y.o.   MRN: [id number]  HPI   Patient presents today for complete physical.  Immunizations:  Due for shingrix #2 Diet: healthy Wt Readings from Last 3 Encounters:  12/06/19 200 lb (90.7 kg)  11/16/18 199 lb 12.8 oz (90.6 kg)  12/25/16 187 lb 12.8 oz (85.2 kg)  Exercise: physically active at his job, walks a lot Colonoscopy:  Up to date on dental and vision  HTN- not taking bp medication.    BP Readings from Last 3 Encounters:  12/06/19 (!) 123/93  11/16/18 (!) 145/94  12/25/16 130/89   OSA- reports that his cpap broke.    Reports that he has some nausea if he doesn't eat, then if he eats he feels better. Denies epigastric pain, black or bloody stools.   Review of Systems  Constitutional: Negative for unexpected weight change.  HENT: Negative for hearing loss and rhinorrhea.   Eyes: Negative for visual disturbance.  Respiratory: Negative for cough and shortness of breath.   Cardiovascular: Negative for chest pain.  Gastrointestinal: Negative for constipation and diarrhea.  Genitourinary: Negative for difficulty urinating and frequency.  Musculoskeletal: Negative for arthralgias and myalgias.  Skin: Negative for rash.  Neurological: Positive for headaches (rarely).  Hematological: Negative for adenopathy.  Psychiatric/Behavioral:       Denies depression/anxiety       Past Medical History:  Diagnosis Date  . Arthritis   . Chest pain 04/08/2013     Social History   Socioeconomic History  . Marital status: Single    Spouse name: Not on file  . Number of children: Not on file  . Years of education: Not on file  . Highest education level: Not on file  Occupational History  . Not on file  Tobacco Use  . Smoking status: Never Smoker  . Smokeless tobacco: Never Used  Substance and Sexual Activity  . Alcohol use: Yes    Comment: occationally  . Drug use: No  . Sexual activity: Not on  file  Other Topics Concern  . 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         Social Determinants of Health   Financial Resource Strain:   . Difficulty of Paying Living Expenses: Not on file  Food Insecurity:   . Worried About Charity fundraiser in the Last Year: Not on file  . Ran Out of Food in the Last Year: Not on file  Transportation Needs:   . Lack of Transportation (Medical): Not on file  . Lack of Transportation (Non-Medical): Not on file  Physical Activity:   . Days of Exercise per Week: Not on file  . Minutes of Exercise per Session: Not on file  Stress:   . Feeling of Stress : Not on file  Social Connections:   . Frequency of Communication with Friends and Family: Not on file  . Frequency of Social Gatherings with Friends and Family: Not on file  . Attends Religious Services: Not on file  . Active Member of Clubs or Organizations: Not on file  . Attends Archivist Meetings: Not on file  . Marital Status: Not on file  Intimate Partner Violence:   . Fear of Current or Ex-Partner: Not on file  . Emotionally Abused: Not on  file  . Physically Abused: Not on file  . Sexually Abused: Not on file    Past Surgical History:  Procedure Laterality Date  . ROTATOR CUFF REPAIR     left    Family History  Problem Relation Age of Onset  . Cancer Mother        bone  . Hypertension Mother   . Heart disease Father   . Hypertension Father   . Lupus Sister   . COPD Brother   . Kidney disease Maternal Uncle        ?kidney failure  . Colon cancer Neg Hx   . Esophageal cancer Neg Hx   . Rectal cancer Neg Hx   . Prostate cancer Neg Hx     No Known Allergies  No current outpatient medications on file prior to visit.   No current facility-administered medications on file prior to visit.    BP (!) 123/93 (BP Location: Right Arm, Patient Position:  Sitting, Cuff Size: Large)   Pulse 87   Temp 97.8 F (36.6 C) (Temporal)   Resp 16   Ht 5\' 9"  (1.753 m)   Wt 200 lb (90.7 kg)   SpO2 97%   BMI 29.53 kg/m    Objective:   Physical Exam Physical Exam  Constitutional: He is oriented to person, place, and time. He appears well-developed and well-nourished. No distress.  HENT:  Head: approximately 1cm wide cyst noted on upper forehead near hairline Right Ear: Tympanic membrane and ear canal normal.  Left Ear: Tympanic membrane and ear canal normal.  Mouth/Throat: Not examined- pt wearing mask Eyes: Pupils are equal, round, and reactive to light. No scleral icterus.  Neck: Normal range of motion. No thyromegaly present.  Cardiovascular: Normal rate and regular rhythm.   No murmur heard. Pulmonary/Chest: Effort normal and breath sounds normal. No respiratory distress. He has no wheezes. He has no rales. He exhibits no tenderness.  Abdominal: Soft. Bowel sounds are normal. He exhibits no distension and no mass. There is no tenderness. There is no rebound and no guarding.  Musculoskeletal: He exhibits no edema.  Lymphadenopathy:    He has no cervical adenopathy.  Neurological: He is alert and oriented to person, place, and time. He has normal patellar reflexes. He exhibits normal muscle tone. Coordination normal.  Skin: Skin is warm and dry. warty appearing growth noted on left forehead Psychiatric: He has a normal mood and affect. His behavior is normal. Judgment and thought content normal.           Assessment & Plan:   Preventative care- tetanus  up to date. Due for shingrix #2 and flu shot today.  Obtain routine lab work including PSA (discussed pros/cons) and pt agrees to proceed.  Colo up to date. Discussed diet/exercise/weight loss.  Cyst- pt desires excision, will refer to dermatology.  HTN- uncontrolled. Restart amlodipine 2.5mg   OSA- needs new cpap, has not worn in >1 year, last sleep study was 2014. Will refer to  sleep specialist.  Nausea- pre-prandial. Will obtain h pylori breath test, check LFT, and give trial of PPI. If neg H pylori and symptoms fail to improve plan abd [place] and GI referral.    This visit occurred during the SARS-CoV-2 public health emergency.  Safety protocols were in place, including screening questions prior to the visit, additional usage of staff PPE, and extensive cleaning of exam room while observing appropriate contact time as indicated for disinfecting solutions.       Assessment &  Plan:

## 2019-12-07 LAB — H. PYLORI BREATH TEST: H. pylori Breath Test: NOT DETECTED

## 2019-12-10 ENCOUNTER — Encounter: Payer: Self-pay | Admitting: Family

## 2019-12-21 NOTE — Progress Notes (Signed)
Mailed out to patient 

## 2019-12-22 ENCOUNTER — Institutional Professional Consult (permissible substitution): Payer: PRIVATE HEALTH INSURANCE | Admitting: Pulmonary Disease

## 2020-01-03 ENCOUNTER — Ambulatory Visit: Payer: PRIVATE HEALTH INSURANCE | Admitting: Family

## 2020-01-03 NOTE — Progress Notes (Deleted)
Virtual Visit via Video Note  I connected with Francisco Carter on 01/03/20 at  8:20 AM EST by a video enabled telemedicine application and verified that I am speaking with the correct person using two identifiers.  Location: Patient: *** Provider: ***   I discussed the limitations of evaluation and management by telemedicine and the availability of in person appointments. The patient expressed understanding and agreed to proceed.  History of Present Illness:    Observations/Objective:   Gen: Awake, alert, no acute distress Resp: Breathing is even and non-labored Psych: calm/pleasant demeanor Neuro: Alert and Oriented x 3, + facial symmetry, speech is clear.   Assessment and Plan:   Follow Up Instructions:    I discussed the assessment and treatment plan with the patient. The patient was provided an opportunity to ask questions and all were answered. The patient agreed with the plan and demonstrated an understanding of the instructions.   The patient was advised to call back or seek an in-person evaluation if the symptoms worsen or if the condition fails to improve as anticipated.  Nance Pear, NP

## 2020-01-04 ENCOUNTER — Institutional Professional Consult (permissible substitution): Payer: PRIVATE HEALTH INSURANCE | Admitting: Pulmonary Disease

## 2020-01-11 ENCOUNTER — Other Ambulatory Visit: Payer: Self-pay

## 2020-01-12 ENCOUNTER — Encounter: Payer: Self-pay | Admitting: Family

## 2020-01-12 ENCOUNTER — Ambulatory Visit (INDEPENDENT_AMBULATORY_CARE_PROVIDER_SITE_OTHER): Payer: PRIVATE HEALTH INSURANCE | Admitting: Family

## 2020-01-12 ENCOUNTER — Other Ambulatory Visit: Payer: Self-pay

## 2020-01-12 DIAGNOSIS — I10 Essential (primary) hypertension: Secondary | ICD-10-CM

## 2020-01-12 DIAGNOSIS — K219 Gastro-esophageal reflux disease without esophagitis: Secondary | ICD-10-CM | POA: Diagnosis not present

## 2020-01-12 MED ORDER — AMLODIPINE BESYLATE 5 MG PO TABS: 5.0000 mg | ORAL_TABLET | Freq: Every day | ORAL | 3 refills | Status: DC

## 2020-01-12 NOTE — Patient Instructions (Signed)
Continue omeprazole for your stomach. Increase amlodipine from 2.5mg  to 5mg .

## 2020-01-12 NOTE — Progress Notes (Signed)
Subjective:    Patient ID: Francisco Carter, male    DOB: Jul 24, 1961, 59 y.o.   MRN: [id number]  HPI  Patient is a 59 year old male who presents today for routine follow-up.  HTN-he is currently maintained on amlodipine 2.5 mg once daily. BP Readings from Last 3 Encounters:  01/12/20 (!) 142/80  12/06/19 (!) 142/95  11/16/18 (!) 145/94   GERD-maintained on omeprazole 40 mg once daily.  Nausea- resolved with omeprazole.    Review of Systems See HPI  Past Medical History:  Diagnosis Date  . Arthritis   . Chest pain 04/08/2013     Social History   Socioeconomic History  . Marital status: Single    Spouse name: Not on file  . Number of children: Not on file  . Years of education: Not on file  . Highest education level: Not on file  Occupational History  . Not on file  Tobacco Use  . Smoking status: Never Smoker  . Smokeless tobacco: Never Used  Substance and Sexual Activity  . Alcohol use: Yes    Comment: occationally  . Drug use: No  . Sexual activity: Not on file  Other Topics Concern  . 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         Social Determinants of Health   Financial Resource Strain:   . Difficulty of Paying Living Expenses: Not on file  Food Insecurity:   . Worried About Charity fundraiser in the Last Year: Not on file  . Ran Out of Food in the Last Year: Not on file  Transportation Needs:   . Lack of Transportation (Medical): Not on file  . Lack of Transportation (Non-Medical): Not on file  Physical Activity:   . Days of Exercise per Week: Not on file  . Minutes of Exercise per Session: Not on file  Stress:   . Feeling of Stress : Not on file  Social Connections:   . Frequency of Communication with Friends and Family: Not on file  . Frequency of Social Gatherings with Friends and Family: Not on file  . Attends Religious Services:  Not on file  . Active Member of Clubs or Organizations: Not on file  . Attends Archivist Meetings: Not on file  . Marital Status: Not on file  Intimate Partner Violence:   . Fear of Current or Ex-Partner: Not on file  . Emotionally Abused: Not on file  . Physically Abused: Not on file  . Sexually Abused: Not on file    Past Surgical History:  Procedure Laterality Date  . ROTATOR CUFF REPAIR     left    Family History  Problem Relation Age of Onset  . Cancer Mother        bone  . Hypertension Mother   . Heart disease Father   . Hypertension Father   . Lupus Sister   . COPD Brother   . Kidney disease Maternal Uncle        ?kidney failure  . Colon cancer Neg Hx   . Esophageal cancer Neg Hx   . Rectal cancer Neg Hx   . Prostate cancer Neg Hx     No Known Allergies  Current Outpatient Medications on File Prior to Visit  Medication Sig Dispense Refill  . omeprazole (PRILOSEC) 40 MG capsule Take 1 capsule (40  mg total) by mouth daily. 30 capsule 3   No current facility-administered medications on file prior to visit.    BP (!) 142/80 (BP Location: Left Arm, Patient Position: Sitting, Cuff Size: Normal)   Pulse 78   Temp (!) 97.4 F (36.3 C) (Temporal)   Resp 16   Ht 5\' 6"  (1.676 m)   Wt 204 lb (92.5 kg)   SpO2 98%   BMI 32.93 kg/m       Objective:   Physical Exam Constitutional:      General: He is not in acute distress.    Appearance: He is well-developed.  HENT:     Head: Normocephalic and atraumatic.  Cardiovascular:     Rate and Rhythm: Normal rate and regular rhythm.     Heart sounds: No murmur.  Pulmonary:     Effort: Pulmonary effort is normal. No respiratory distress.     Breath sounds: Normal breath sounds. No wheezing or rales.  Skin:    General: Skin is warm and dry.  Neurological:     Mental Status: He is alert and oriented to person, place, and time.  Psychiatric:        Behavior: Behavior normal.        Thought Content:  Thought content normal.           Assessment & Plan:  HTN- sbp still above goal. Will increase amlodipine from 2.5 mg to 5mg  once daily. Repeat bp in 1 month.  Nausea/GERD- likely was due to mild gastritis/GERD. LFT's were WNL last visit. Resolved after starting PPI. Continue same.

## 2020-01-16 ENCOUNTER — Ambulatory Visit: Payer: PRIVATE HEALTH INSURANCE | Attending: Internal Medicine

## 2020-01-16 DIAGNOSIS — Z20822 Contact with and (suspected) exposure to covid-19: Secondary | ICD-10-CM

## 2020-01-17 LAB — NOVEL CORONAVIRUS, NAA: SARS-CoV-2, NAA: NOT DETECTED

## 2020-01-24 ENCOUNTER — Institutional Professional Consult (permissible substitution): Payer: PRIVATE HEALTH INSURANCE | Admitting: Pulmonary Disease

## 2020-02-09 ENCOUNTER — Ambulatory Visit: Payer: PRIVATE HEALTH INSURANCE | Admitting: Family

## 2020-02-09 DIAGNOSIS — Z0289 Encounter for other administrative examinations: Secondary | ICD-10-CM

## 2020-02-16 ENCOUNTER — Other Ambulatory Visit: Payer: Self-pay

## 2020-02-16 ENCOUNTER — Encounter: Payer: Self-pay | Admitting: Family

## 2020-02-16 ENCOUNTER — Ambulatory Visit (INDEPENDENT_AMBULATORY_CARE_PROVIDER_SITE_OTHER): Payer: PRIVATE HEALTH INSURANCE | Admitting: Family

## 2020-02-16 VITALS — BP 133/87 | HR 71 | Temp 97.4°F | Resp 16 | Ht 69.0 in | Wt 198.0 lb

## 2020-02-16 DIAGNOSIS — R5383 Other fatigue: Secondary | ICD-10-CM

## 2020-02-16 DIAGNOSIS — R739 Hyperglycemia, unspecified: Secondary | ICD-10-CM | POA: Diagnosis not present

## 2020-02-16 DIAGNOSIS — I10 Essential (primary) hypertension: Secondary | ICD-10-CM | POA: Diagnosis not present

## 2020-02-16 LAB — HEMOGLOBIN A1C: Hgb A1c MFr Bld: 5.9 % (ref 4.6–6.5)

## 2020-02-16 MED ORDER — OMEPRAZOLE 40 MG PO CPDR: 40.0000 mg | DELAYED_RELEASE_CAPSULE | Freq: Every day | ORAL | 1 refills | Status: DC

## 2020-02-16 NOTE — Progress Notes (Signed)
Subjective:    Patient ID: Francisco Carter, male    DOB: 02-20-1961, 59 y.o.   MRN: [id number]  HPI  Patient is a 59 yr old male who presents today for routine follow up.  HTN- Maintained on amlodipine. Denies LE edema.  BP Readings from Last 3 Encounters:  02/16/20 133/87  01/12/20 (!) 142/80  12/06/19 (!) 142/95   GERD- maintained on omeprazole 40mg . Reports symptoms are well controlled as long as he is taking omeprazole.    Notes low libido as well as some fatigue. Would like to have his testosterone checked.    Review of Systems See HPI  Past Medical History:  Diagnosis Date  . Arthritis   . Chest pain 04/08/2013     Social History   Socioeconomic History  . Marital status: Single    Spouse name: Not on file  . Number of children: Not on file  . Years of education: Not on file  . Highest education level: Not on file  Occupational History  . Not on file  Tobacco Use  . Smoking status: Never Smoker  . Smokeless tobacco: Never Used  Substance and Sexual Activity  . Alcohol use: Yes    Comment: occationally  . Drug use: No  . Sexual activity: Not on file  Other Topics Concern  . Not on file  Social History Narrative   Married 4/15 (first marriage)   Truck driver    2 children 57 and 22   Enjoys movies, visiting family.         Social Determinants of Health   Financial Resource Strain:   . Difficulty of Paying Living Expenses: Not on file  Food Insecurity:   . Worried About Charity fundraiser in the Last Year: Not on file  . Ran Out of Food in the Last Year: Not on file  Transportation Needs:   . Lack of Transportation (Medical): Not on file  . Lack of Transportation (Non-Medical): Not on file  Physical Activity:   . Days of Exercise per Week: Not on file  . Minutes of Exercise per Session: Not on file  Stress:   . Feeling of Stress : Not on file  Social Connections:   . Frequency of Communication with Friends and Family: Not on file  .  Frequency of Social Gatherings with Friends and Family: Not on file  . Attends Religious Services: Not on file  . Active Member of Clubs or Organizations: Not on file  . Attends Archivist Meetings: Not on file  . Marital Status: Not on file  Intimate Partner Violence:   . Fear of Current or Ex-Partner: Not on file  . Emotionally Abused: Not on file  . Physically Abused: Not on file  . Sexually Abused: Not on file    Past Surgical History:  Procedure Laterality Date  . ROTATOR CUFF REPAIR     left    Family History  Problem Relation Age of Onset  . Cancer Mother        bone  . Hypertension Mother   . Heart disease Father   . Hypertension Father   . Lupus Sister   . COPD Brother   . Kidney disease Maternal Uncle        ?kidney failure  . Colon cancer Neg Hx   . Esophageal cancer Neg Hx   . Rectal cancer Neg Hx   . Prostate cancer Neg Hx     No Known Allergies  Current  Outpatient Medications on File Prior to Visit  Medication Sig Dispense Refill  . amLODipine (NORVASC) 5 MG tablet Take 1 tablet (5 mg total) by mouth daily. 30 tablet 3  . omeprazole (PRILOSEC) 40 MG capsule Take 1 capsule (40 mg total) by mouth daily. 30 capsule 3   No current facility-administered medications on file prior to visit.    BP 133/87 (BP Location: Right Arm, Patient Position: Sitting, Cuff Size: Small)   Pulse 71   Temp (!) 97.4 F (36.3 C) (Temporal)   Resp 16   Ht 5\' 9"  (1.753 m)   Wt 198 lb (89.8 kg)   SpO2 100%   BMI 29.24 kg/m       Objective:   Physical Exam Constitutional:      General: He is not in acute distress.    Appearance: He is well-developed.  HENT:     Head: Normocephalic and atraumatic.  Cardiovascular:     Rate and Rhythm: Normal rate and regular rhythm.     Heart sounds: No murmur.  Pulmonary:     Effort: Pulmonary effort is normal. No respiratory distress.     Breath sounds: Normal breath sounds. No wheezing or rales.  Skin:    General:  Skin is warm and dry.  Neurological:     Mental Status: He is alert and oriented to person, place, and time.  Psychiatric:        Behavior: Behavior normal.        Thought Content: Thought content normal.           Assessment & Plan:  HTN-bp stable on current dose of amlodipine. Continue same.  Hyperglycemia- mild hyperglycemia noted on last lab. Will obtain A1C.  Fatigue- will obtain testosterone level.   This visit occurred during the SARS-CoV-2 public health emergency.  Safety protocols were in place, including screening questions prior to the visit, additional usage of staff PPE, and extensive cleaning of exam room while observing appropriate contact time as indicated for disinfecting solutions.

## 2020-02-16 NOTE — Patient Instructions (Signed)
Below are two ways to schedule a Covid-19 Vaccine:  Please visit [email or web address] to register or call [phone]  Or call:  [place] Covid-19 vaccine scheduling at [phone]  

## 2020-02-19 ENCOUNTER — Telehealth: Payer: Self-pay | Admitting: Family

## 2020-02-19 ENCOUNTER — Encounter: Payer: Self-pay | Admitting: Family

## 2020-02-19 DIAGNOSIS — E291 Testicular hypofunction: Secondary | ICD-10-CM

## 2020-02-19 LAB — TESTOSTERONE TOTAL,FREE,BIO, MALES
Albumin: 4.1 g/dL (ref 3.6–5.1)
Sex Hormone Binding: 47 nmol/L (ref 22–77)
Testosterone, Bioavailable: 81 ng/dL — ABNORMAL LOW (ref >=110.0)
Testosterone, Free: 43 pg/mL — ABNORMAL LOW (ref 46.0–224.0)
Testosterone: 433 ng/dL (ref 250–827)

## 2020-02-19 NOTE — Telephone Encounter (Signed)
Advised patient of results, he agrees with referral.

## 2020-02-19 NOTE — Telephone Encounter (Signed)
Please advise pt that his sugar test looks good. Testosterone is low. I have pended a referral to [facility] for consultation for testosterone replacement therapy.

## 2020-03-29 ENCOUNTER — Ambulatory Visit: Payer: PRIVATE HEALTH INSURANCE | Attending: Internal Medicine

## 2020-03-29 DIAGNOSIS — Z23 Encounter for immunization: Secondary | ICD-10-CM

## 2020-03-29 NOTE — Progress Notes (Signed)
   Covid-19 Vaccination Clinic  Name:  Francisco Carter    MRN: [id number] DOB: [birth year]  03/29/2020  [name] was observed post Covid-19 immunization for 15 minutes without incident. He was provided with Vaccine Information Sheet and instruction to access the V-Safe system.   [name] was instructed to call 911 with any severe reactions post vaccine: Marland Kitchen Difficulty breathing  . Swelling of face and throat  . A fast heartbeat  . A bad rash all over body  . Dizziness and weakness   Immunizations Administered    Name Date Dose VIS Date Route   Pfizer COVID-19 Vaccine 03/29/2020  8:14 AM 0.3 mL 11/24/2019 Intramuscular   Manufacturer: [employer]   Lot: [id number]   [place]: [id number]

## 2020-04-22 ENCOUNTER — Ambulatory Visit: Payer: PRIVATE HEALTH INSURANCE | Attending: Internal Medicine

## 2020-04-22 DIAGNOSIS — Z23 Encounter for immunization: Secondary | ICD-10-CM

## 2020-04-22 NOTE — Progress Notes (Signed)
   Covid-19 Vaccination Clinic  Name:  Francisco Carter    MRN: [id number] DOB: 10/04/1961  04/22/2020  [name] was observed post Covid-19 immunization for 15 minutes without incident. He was provided with Vaccine Information Sheet and instruction to access the V-Safe system.   [name] was instructed to call 911 with any severe reactions post vaccine: Marland Kitchen Difficulty breathing  . Swelling of face and throat  . A fast heartbeat  . A bad rash all over body  . Dizziness and weakness   Immunizations Administered    Name Date Dose VIS Date Route   Pfizer COVID-19 Vaccine 04/22/2020  8:18 AM 0.3 mL 02/07/2019 Intramuscular   Manufacturer: [place]   Lot: [id number]   [place]: [id number]      Covid-19 Vaccination Clinic  Name:  Francisco Carter    MRN: [id number] DOB: 06/01/1961  04/22/2020  [name] was observed post Covid-19 immunization for 15 minutes without incident. He was provided with Vaccine Information Sheet and instruction to access the V-Safe system.   [name] was instructed to call 911 with any severe reactions post vaccine: Marland Kitchen Difficulty breathing  . Swelling of face and throat  . A fast heartbeat  . A bad rash all over body  . Dizziness and weakness   Immunizations Administered    Name Date Dose VIS Date Route   Pfizer COVID-19 Vaccine 04/22/2020  8:18 AM 0.3 mL 02/07/2019 Intramuscular   Manufacturer: [place]   Lot: [id number]   [place]: [id number]

## 2020-05-30 ENCOUNTER — Other Ambulatory Visit: Payer: Self-pay | Admitting: Family

## 2020-05-30 ENCOUNTER — Telehealth: Payer: Self-pay | Admitting: Family

## 2020-05-30 MED ORDER — AMLODIPINE BESYLATE 5 MG PO TABS: 5.0000 mg | ORAL_TABLET | Freq: Every day | ORAL | 3 refills | Status: DC

## 2020-05-30 NOTE — Telephone Encounter (Signed)
See order(s).

## 2020-06-21 ENCOUNTER — Other Ambulatory Visit: Payer: Self-pay | Admitting: Family

## 2020-06-24 ENCOUNTER — Telehealth: Payer: Self-pay | Admitting: Family

## 2020-06-24 MED ORDER — AMLODIPINE BESYLATE 5 MG PO TABS: 5.0000 mg | ORAL_TABLET | Freq: Every day | ORAL | 3 refills | Status: DC

## 2020-06-24 NOTE — Telephone Encounter (Signed)
See mychart.  

## 2020-08-23 ENCOUNTER — Other Ambulatory Visit: Payer: Self-pay

## 2020-08-23 ENCOUNTER — Encounter: Payer: Self-pay | Admitting: Family

## 2020-08-23 ENCOUNTER — Ambulatory Visit (INDEPENDENT_AMBULATORY_CARE_PROVIDER_SITE_OTHER): Payer: Self-pay | Admitting: Family

## 2020-08-23 VITALS — BP 140/79 | HR 71 | Temp 98.7°F | Resp 16 | Ht 69.0 in | Wt 199.0 lb

## 2020-08-23 DIAGNOSIS — Z0000 Encounter for general adult medical examination without abnormal findings: Secondary | ICD-10-CM

## 2020-08-23 DIAGNOSIS — G4733 Obstructive sleep apnea (adult) (pediatric): Secondary | ICD-10-CM

## 2020-08-23 DIAGNOSIS — I10 Essential (primary) hypertension: Secondary | ICD-10-CM

## 2020-08-23 DIAGNOSIS — K219 Gastro-esophageal reflux disease without esophagitis: Secondary | ICD-10-CM

## 2020-08-23 DIAGNOSIS — Z23 Encounter for immunization: Secondary | ICD-10-CM

## 2020-08-23 NOTE — Patient Instructions (Addendum)
Please schedule routine dental visit.  You should be contacted about your referral to the sleep specialist.  Please complete lab work prior to leaving.    Preventive Care 1-59 Years Old, Male Preventive care refers to lifestyle choices and visits with your health care provider that can promote health and wellness. This includes:  A yearly physical exam. This is also called an annual well check.  Regular dental and eye exams.  Immunizations.  Screening for certain conditions.  Healthy lifestyle choices, such as eating a healthy diet, getting regular exercise, not using drugs or products that contain nicotine and tobacco, and limiting alcohol use. What can I expect for my preventive care visit? Physical exam Your health care provider will check:  Height and weight. These may be used to calculate body mass index (BMI), which is a measurement that tells if you are at a healthy weight.  Heart rate and blood pressure.  Your skin for abnormal spots. Counseling Your health care provider may ask you questions about:  Alcohol, tobacco, and drug use.  Emotional well-being.  Home and relationship well-being.  Sexual activity.  Eating habits.  Work and work Astronomer. What immunizations do I need?  Influenza (flu) vaccine  This is recommended every year. Tetanus, diphtheria, and pertussis (Tdap) vaccine  You may need a Td booster every 10 years. Varicella (chickenpox) vaccine  You may need this vaccine if you have not already been vaccinated. Zoster (shingles) vaccine  You may need this after age 59. Measles, mumps, and rubella (MMR) vaccine  You may need at least one dose of MMR if you were born in 1957 or later. You may also need a second dose. Pneumococcal conjugate (PCV13) vaccine  You may need this if you have certain conditions and were not previously vaccinated. Pneumococcal polysaccharide (PPSV23) vaccine  You may need one or two doses if you smoke  cigarettes or if you have certain conditions. Meningococcal conjugate (MenACWY) vaccine  You may need this if you have certain conditions. Hepatitis A vaccine  You may need this if you have certain conditions or if you travel or work in places where you may be exposed to hepatitis A. Hepatitis B vaccine  You may need this if you have certain conditions or if you travel or work in places where you may be exposed to hepatitis B. Haemophilus influenzae type b (Hib) vaccine  You may need this if you have certain risk factors. Human papillomavirus (HPV) vaccine  If recommended by your health care provider, you may need three doses over 6 months. You may receive vaccines as individual doses or as more than one vaccine together in one shot (combination vaccines). Talk with your health care provider about the risks and benefits of combination vaccines. What tests do I need? Blood tests  Lipid and cholesterol levels. These may be checked every 5 years, or more frequently if you are over 58 years old.  Hepatitis C test.  Hepatitis B test. Screening  Lung cancer screening. You may have this screening every year starting at age 35 if you have a 30-pack-year history of smoking and currently smoke or have quit within the past 15 years.  Prostate cancer screening. Recommendations will vary depending on your family history and other risks.  Colorectal cancer screening. All adults should have this screening starting at age 65 and continuing until age 55. Your health care provider may recommend screening at age 65 if you are at increased risk. You will have tests every 1-10  years, depending on your results and the type of screening test.  Diabetes screening. This is done by checking your blood sugar (glucose) after you have not eaten for a while (fasting). You may have this done every 1-3 years.  Sexually transmitted disease (STD) testing. Follow these instructions at home: Eating and  drinking  Eat a diet that includes fresh fruits and vegetables, whole grains, lean protein, and low-fat dairy products.  Take vitamin and mineral supplements as recommended by your health care provider.  Do not drink alcohol if your health care provider tells you not to drink.  If you drink alcohol: ? Limit how much you have to 0-2 drinks a day. ? Be aware of how much alcohol is in your drink. In the [place] one drink equals one 12 oz bottle of beer (355 mL), one 5 oz glass of wine (148 mL), or one 1 oz glass of hard liquor (44 mL). Lifestyle  Take daily care of your teeth and gums.  Stay active. Exercise for at least 30 minutes on 5 or more days each week.  Do not use any products that contain nicotine or tobacco, such as cigarettes, e-cigarettes, and chewing tobacco. If you need help quitting, ask your health care provider.  If you are sexually active, practice safe sex. Use a condom or other form of protection to prevent STIs (sexually transmitted infections).  Talk with your health care provider about taking a low-dose aspirin every day starting at age 80. What's next?  Go to your health care provider once a year for a well check visit.  Ask your health care provider how often you should have your eyes and teeth checked.  Stay up to date on all vaccines. This information is not intended to replace advice given to you by your health care provider. Make sure you discuss any questions you have with your health care provider. Document Revised: 11/24/2018 Document Reviewed: 11/24/2018 Elsevier Patient Education  2020 [employer].

## 2020-08-23 NOTE — Addendum Note (Signed)
Addended by: Jiles Prows on: 08/23/2020 09:54 AM   Modules accepted: Orders

## 2020-08-23 NOTE — Progress Notes (Signed)
Subjective:    Patient ID: Francisco Carter, male    DOB: Aug 05, 1961, 59 y.o.   MRN: [id number]  HPI  Patient presents today for complete physical.  Immunizations: Tdap due 4/22. Completed pfizer. Flu shot today  Diet:  Working on healthy diet Wt Readings from Last 3 Encounters:  08/23/20 199 lb (90.3 kg)  02/16/20 198 lb (89.8 kg)  01/12/20 204 lb (92.5 kg)  Exercise:  walking Colonoscopy: due 2023 Vision: last year Dental: due PSA:   Lab Results  Component Value Date   PSA 1.67 12/06/2019   PSA 3.59 11/16/2018   PSA 1.24 12/25/2016   HTN- maintained on amlodipine 5mg . BP Readings from Last 3 Encounters:  08/23/20 140/79  02/16/20 133/87  01/12/20 (!) 142/80   GERD- not needing omeprazole.   OSA- has cpap at home.  Needs new machine   Review of Systems  Constitutional: Negative for unexpected weight change.  HENT: Negative for hearing loss and rhinorrhea.   Eyes: Negative for visual disturbance.  Respiratory: Negative for cough and shortness of breath.   Cardiovascular: Negative for chest pain.  Gastrointestinal: Negative for blood in stool, constipation and diarrhea.  Genitourinary: Negative for difficulty urinating, frequency and hematuria.  Musculoskeletal: Positive for arthralgias (some left shoulder pain- hx of RTC repair). Negative for myalgias.  Skin: Negative for rash.  Neurological: Negative for headaches.  Hematological: Negative for adenopathy.  Psychiatric/Behavioral:       Denies depression/anxiety   Past Medical History:  Diagnosis Date   Arthritis    Chest pain 04/08/2013     Social History   Socioeconomic History   Marital status: Single    Spouse name: Not on file   Number of children: Not on file   Years of education: Not on file   Highest education level: Not on file  Occupational History   Not on file  Tobacco Use   Smoking status: Never Smoker   Smokeless tobacco: Never Used  Substance and Sexual Activity    Alcohol use: Yes    Comment: occationally   Drug use: No   Sexual activity: Not on file  Other Topics Concern   Not on file  Social History Narrative   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         Social Determinants of Health   Financial Resource Strain:    Difficulty of Paying Living Expenses: Not on file  Food Insecurity:    Worried About Charity fundraiser in the Last Year: Not on file   [employer] of Food in the Last Year: Not on file  Transportation Needs:    Lack of Transportation (Medical): Not on file   Lack of Transportation (Non-Medical): Not on file  Physical Activity:    Days of Exercise per Week: Not on file   Minutes of Exercise per Session: Not on file  Stress:    Feeling of Stress : Not on file  Social Connections:    Frequency of Communication with Friends and Family: Not on file   Frequency of Social Gatherings with Friends and Family: Not on file   Attends Religious Services: Not on file   Active Member of Clubs or Organizations: Not on file   Attends Archivist Meetings: Not on file   Marital Status: Not on file  Intimate Partner Violence:    Fear of Current or Ex-Partner: Not on  file   Emotionally Abused: Not on file   Physically Abused: Not on file   Sexually Abused: Not on file    Past Surgical History:  Procedure Laterality Date   ROTATOR CUFF REPAIR     left    Family History  Problem Relation Age of Onset   Cancer Mother        bone   Hypertension Mother    Heart disease Father    Hypertension Father    Lupus Sister    COPD Brother    Kidney disease Maternal Uncle        ?kidney failure   Colon cancer Neg Hx    Esophageal cancer Neg Hx    Rectal cancer Neg Hx    Prostate cancer Neg Hx     No Known Allergies  Current Outpatient Medications on File Prior to Visit  Medication Sig Dispense Refill   amLODipine (NORVASC)  5 MG tablet Take 1 tablet (5 mg total) by mouth daily. 30 tablet 3   No current facility-administered medications on file prior to visit.    BP 140/79 (BP Location: Right Arm, Patient Position: Sitting, Cuff Size: Small)    Pulse 71    Temp 98.7 F (37.1 C) (Oral)    Resp 16    Ht 5\' 9"  (1.753 m)    Wt 199 lb (90.3 kg)    SpO2 100%    BMI 29.39 kg/m        Objective:   Physical Exam  Physical Exam  Constitutional: He is oriented to person, place, and time. He appears well-developed and well-nourished. No distress.  HENT:  Head: Normocephalic and atraumatic.  Right Ear: Tympanic membrane and ear canal normal.  Left Ear: Tympanic membrane and ear canal normal.  Mouth/Throat: Oropharynx is clear and moist.  Eyes: Pupils are equal, round, and reactive to light. No scleral icterus.  Neck: Normal range of motion. No thyromegaly present.  Cardiovascular: Normal rate and regular rhythm.   No murmur heard. Pulmonary/Chest: Effort normal and breath sounds normal. No respiratory distress. He has no wheezes. He has no rales. He exhibits no tenderness.  Abdominal: Soft. Bowel sounds are normal. He exhibits no distension and no mass. There is no tenderness. There is no rebound and no guarding.  Musculoskeletal: He exhibits no edema.  Lymphadenopathy:    He has no cervical adenopathy.  Neurological: He is alert and oriented to person, place, and time. He has normal patellar reflexes. He exhibits normal muscle tone. Coordination normal.  Skin: Skin is warm and dry.  Psychiatric: He has a normal mood and affect. His behavior is normal. Judgment and thought content normal.           Assessment & Plan:  Preventative care- discussed healthy diet, exercise.  Obtain routine lab work. Tdap and flu shot today. Recommended 8 month pfizer booster.  HTN- bp is acceptable. Continue current dose of amlodipine.  OSA- needs new machine.  Will refer to sleep specialist for ongoing management.  GERD-  stable without medication.  Monitor.  This visit occurred during the SARS-CoV-2 public health emergency.  Safety protocols were in place, including screening questions prior to the visit, additional usage of staff PPE, and extensive cleaning of exam room while observing appropriate contact time as indicated for disinfecting solutions.          Assessment & Plan:

## 2020-08-24 LAB — HEPATIC FUNCTION PANEL
AG Ratio: 1.7 (calc) (ref 1.0–2.5)
ALT: 20 U/L (ref 9–46)
AST: 18 U/L (ref 10–35)
Albumin: 4.3 g/dL (ref 3.6–5.1)
Alkaline phosphatase (APISO): 93 U/L (ref 35–144)
Bilirubin, Direct: 0.1 mg/dL (ref 0.0–0.2)
Globulin: 2.5 g/dL (calc) (ref 1.9–3.7)
Indirect Bilirubin: 0.5 mg/dL (calc) (ref 0.2–1.2)
Total Bilirubin: 0.6 mg/dL (ref 0.2–1.2)
Total Protein: 6.8 g/dL (ref 6.1–8.1)

## 2020-08-24 LAB — CBC WITH DIFFERENTIAL/PLATELET
Absolute Monocytes: 413 cells/uL (ref 200–950)
Basophils Absolute: 28 cells/uL (ref 0–200)
Basophils Relative: 0.5 %
Eosinophils Absolute: 132 cells/uL (ref 15–500)
Eosinophils Relative: 2.4 %
HCT: 44 % (ref 38.5–50.0)
Hemoglobin: 14.8 g/dL (ref 13.2–17.1)
Lymphs Abs: 1485 cells/uL (ref 850–3900)
MCH: 29.5 pg (ref 27.0–33.0)
MCHC: 33.6 g/dL (ref 32.0–36.0)
MCV: 87.8 fL (ref 80.0–100.0)
MPV: 10.4 fL (ref 7.5–12.5)
Monocytes Relative: 7.5 %
Neutro Abs: 3443 cells/uL (ref 1500–7800)
Neutrophils Relative %: 62.6 %
Platelets: 226 10*3/uL (ref 140–400)
RBC: 5.01 10*6/uL (ref 4.20–5.80)
RDW: 12.4 % (ref 11.0–15.0)
Total Lymphocyte: 27 %
WBC: 5.5 10*3/uL (ref 3.8–10.8)

## 2020-08-24 LAB — TSH: TSH: 2.45 mIU/L (ref 0.40–4.50)

## 2020-08-24 LAB — LIPID PANEL
Cholesterol: 154 mg/dL (ref <200)
HDL: 43 mg/dL (ref >=40)
LDL Cholesterol (Calc): 91 mg/dL (calc)
Non-HDL Cholesterol (Calc): 111 mg/dL (calc) (ref <130)
Total CHOL/HDL Ratio: 3.6 (calc) (ref <5.0)
Triglycerides: 106 mg/dL (ref <150)

## 2020-08-24 LAB — BASIC METABOLIC PANEL
BUN: 13 mg/dL (ref 7–25)
CO2: 27 mmol/L (ref 20–32)
Calcium: 9.3 mg/dL (ref 8.6–10.3)
Chloride: 105 mmol/L (ref 98–110)
Creat: 1.05 mg/dL (ref 0.70–1.33)
Glucose, Bld: 104 mg/dL — ABNORMAL HIGH (ref 65–99)
Potassium: 4 mmol/L (ref 3.5–5.3)
Sodium: 141 mmol/L (ref 135–146)

## 2020-08-24 LAB — PSA: PSA: 1.9 ng/mL (ref <=4.0)

## 2020-08-25 ENCOUNTER — Encounter: Payer: Self-pay | Admitting: Family

## 2020-08-26 NOTE — Progress Notes (Signed)
Mailed out to patient 

## 2020-10-18 ENCOUNTER — Institutional Professional Consult (permissible substitution): Payer: Self-pay | Admitting: Pulmonary Disease

## 2020-12-09 ENCOUNTER — Other Ambulatory Visit: Payer: Self-pay | Admitting: Family

## 2021-02-21 ENCOUNTER — Ambulatory Visit: Payer: Self-pay | Admitting: Family

## 2021-02-28 ENCOUNTER — Ambulatory Visit (INDEPENDENT_AMBULATORY_CARE_PROVIDER_SITE_OTHER): Payer: PRIVATE HEALTH INSURANCE | Admitting: Family

## 2021-02-28 ENCOUNTER — Other Ambulatory Visit: Payer: Self-pay

## 2021-02-28 ENCOUNTER — Encounter: Payer: Self-pay | Admitting: Family

## 2021-02-28 VITALS — BP 125/80 | HR 73 | Temp 98.5°F | Resp 16 | Ht 69.0 in | Wt 196.0 lb

## 2021-02-28 DIAGNOSIS — R739 Hyperglycemia, unspecified: Secondary | ICD-10-CM

## 2021-02-28 DIAGNOSIS — M79604 Pain in right leg: Secondary | ICD-10-CM

## 2021-02-28 DIAGNOSIS — I10 Essential (primary) hypertension: Secondary | ICD-10-CM

## 2021-02-28 DIAGNOSIS — M79605 Pain in left leg: Secondary | ICD-10-CM

## 2021-02-28 DIAGNOSIS — L729 Follicular cyst of the skin and subcutaneous tissue, unspecified: Secondary | ICD-10-CM

## 2021-02-28 LAB — BASIC METABOLIC PANEL
BUN: 11 mg/dL (ref 6–23)
CO2: 28 mEq/L (ref 19–32)
Calcium: 9.4 mg/dL (ref 8.4–10.5)
Chloride: 105 mEq/L (ref 96–112)
Creatinine, Ser: 1.05 mg/dL (ref 0.40–1.50)
GFR: 77.83 mL/min (ref >60.00)
Glucose, Bld: 96 mg/dL (ref 70–99)
Potassium: 4.2 mEq/L (ref 3.5–5.1)
Sodium: 140 mEq/L (ref 135–145)

## 2021-02-28 LAB — HEMOGLOBIN A1C: Hgb A1c MFr Bld: 5.8 % (ref 4.6–6.5)

## 2021-02-28 MED ORDER — AMLODIPINE BESYLATE 5 MG PO TABS: 5.0000 mg | ORAL_TABLET | Freq: Every day | ORAL | 0 refills | Status: DC

## 2021-02-28 NOTE — Progress Notes (Signed)
   Subjective:    Patient ID: Francisco Carter, male    DOB: [birth year], 60 y.o.   MRN: [id number]  HPI  Patient is a 60 yr old male who presents today for follow up.  HTN- maintained on amlodipine 5mg  once daily.  BP Readings from Last 3 Encounters:  02/28/21 125/80  08/23/20 140/79  02/16/20 133/87   Bilateral leg pain- notes occasional shin pain bilaterally.  A few times a month.   Would like removal of scalp cyst.    Review of Systems     Objective:   Physical Exam Constitutional:      General: He is not in acute distress.    Appearance: He is well-developed.  HENT:     Head: Atraumatic.     Comments: Soft, mobile cyst noted frontal scalp Cardiovascular:     Rate and Rhythm: Normal rate and regular rhythm.     Heart sounds: No murmur heard.   Pulmonary:     Effort: Pulmonary effort is normal. No respiratory distress.     Breath sounds: Normal breath sounds. No wheezing or rales.  Skin:    General: Skin is warm and dry.  Neurological:     Mental Status: He is alert and oriented to person, place, and time.  Psychiatric:        Behavior: Behavior normal.        Thought Content: Thought content normal.           Assessment & Plan:  Scalp cyst- refer to surgeon for excision.  HTN- bp stable on amlodipine 5mg .    Hyperglycemia- obtain follow up A1C.  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 Suggested increased walking stretching.  Monitor.  This visit occurred during the SARS-CoV-2 public health emergency.  Safety protocols were in place, including screening questions prior to the visit, additional usage of staff PPE, and extensive cleaning of exam room while observing appropriate contact time as indicated for disinfecting solutions.

## 2021-02-28 NOTE — Patient Instructions (Addendum)
Please complete lab work prior to leaving. You should be contacted about scheduling your lab work.

## 2021-03-03 NOTE — Progress Notes (Signed)
Mailed out to patient 

## 2021-06-13 ENCOUNTER — Other Ambulatory Visit: Payer: Self-pay | Admitting: Family

## 2021-08-20 ENCOUNTER — Institutional Professional Consult (permissible substitution): Payer: Self-pay | Admitting: Pulmonary Disease

## 2021-09-01 ENCOUNTER — Ambulatory Visit: Payer: PRIVATE HEALTH INSURANCE | Admitting: Family

## 2021-09-15 ENCOUNTER — Other Ambulatory Visit: Payer: Self-pay | Admitting: Family

## 2021-12-02 ENCOUNTER — Ambulatory Visit: Payer: PRIVATE HEALTH INSURANCE | Admitting: Family

## 2021-12-17 ENCOUNTER — Other Ambulatory Visit: Payer: Self-pay | Admitting: Family

## 2022-01-12 ENCOUNTER — Other Ambulatory Visit: Payer: Self-pay | Admitting: Family

## 2022-01-21 ENCOUNTER — Other Ambulatory Visit: Payer: Self-pay | Admitting: Family

## 2022-01-22 ENCOUNTER — Telehealth: Payer: Self-pay | Admitting: Family

## 2022-01-22 NOTE — Telephone Encounter (Signed)
Patient needs meds refill, per notes appointment is needed. Patient scheduled with [facility] 2/13 @ 1120.   Please advise.

## 2022-01-23 MED ORDER — AMLODIPINE BESYLATE 5 MG PO TABS
5.0000 mg | ORAL_TABLET | Freq: Every day | ORAL | 0 refills | Status: DC
Start: 2022-01-23 — End: 2022-02-16

## 2022-01-23 NOTE — Telephone Encounter (Signed)
Rx sent 

## 2022-01-26 ENCOUNTER — Ambulatory Visit: Payer: PRIVATE HEALTH INSURANCE | Admitting: Family

## 2022-01-26 NOTE — Progress Notes (Incomplete)
Subjective:   By signing my name below, I, Francisco Carter, attest that this documentation has been prepared under the direction and in the presence of Francisco Alar, NP 01/26/2022     Patient ID: Francisco Carter, male    DOB: 1961/01/18, 61 y.o.   MRN: [id number]  No chief complaint on file.   HPI Patient is in today for an office visit.  Past Medical History:  Diagnosis Date   Arthritis    Chest pain 04/08/2013    Past Surgical History:  Procedure Laterality Date   ROTATOR CUFF REPAIR     left    Family History  Problem Relation Age of Onset   Cancer Mother        bone   Hypertension Mother    Heart disease Father    Hypertension Father    Lupus Sister    COPD Brother    Kidney disease Maternal Uncle        ?kidney failure   Colon cancer Neg Hx    Esophageal cancer Neg Hx    Rectal cancer Neg Hx    Prostate cancer Neg Hx     Social History   Socioeconomic History   Marital status: Single    Spouse name: Not on file   Number of children: Not on file   Years of education: Not on file   Highest education level: Not on file  Occupational History   Not on file  Tobacco Use   Smoking status: Never   Smokeless tobacco: Never  Substance and Sexual Activity   Alcohol use: Yes    Comment: occationally   Drug use: No   Sexual activity: Not on file  Other Topics Concern   Not on file  Social History Narrative   Married 4/15 (first marriage)   Truck driver    2 children 65 and 60   Enjoys movies, visiting family.         Social Determinants of Health   Financial Resource Strain: Not on file  Food Insecurity: Not on file  Transportation Needs: Not on file  Physical Activity: Not on file  Stress: Not on file  Social Connections: Not on file  Intimate Partner Violence: Not on file    Outpatient Medications Prior to Visit  Medication Sig Dispense Refill   amLODipine (NORVASC) 5 MG tablet Take 1 tablet (5 mg total) by mouth daily. 30 tablet 0    No facility-administered medications prior to visit.    No Known Allergies  Review of Systems  Constitutional:  Negative for fever.  HENT:  Negative for ear pain and hearing loss.        (-)nystagmus (-)adenopathy  Eyes:  Negative for blurred vision.  Respiratory:  Negative for cough, shortness of breath and wheezing.   Cardiovascular:  Negative for chest pain and leg swelling.  Gastrointestinal:  Negative for blood in stool, diarrhea, nausea and vomiting.  Genitourinary:  Negative for dysuria and frequency.  Musculoskeletal:  Negative for joint pain and myalgias.  Skin:  Negative for rash.  Neurological:  Negative for headaches.  Psychiatric/Behavioral:  Negative for depression. The patient is not nervous/anxious.       Objective:    Physical Exam Constitutional:      General: He is not in acute distress.    Appearance: Normal appearance.  HENT:     Head: Normocephalic and atraumatic.  Cardiovascular:     Rate and Rhythm: Normal rate and regular rhythm.  Heart sounds: No murmur heard. Pulmonary:     Effort: No respiratory distress.     Breath sounds: Normal breath sounds. No wheezing or rales.  Skin:    General: Skin is warm and dry.  Neurological:     Mental Status: He is alert and oriented to person, place, and time.  Psychiatric:        Behavior: Behavior normal.        Thought Content: Thought content normal.    There were no vitals taken for this visit. Wt Readings from Last 3 Encounters:  02/28/21 196 lb (88.9 kg)  08/23/20 199 lb (90.3 kg)  02/16/20 198 lb (89.8 kg)    Diabetic Foot Exam - Simple   No data filed    Lab Results  Component Value Date   WBC 5.5 08/23/2020   HGB 14.8 08/23/2020   HCT 44.0 08/23/2020   PLT 226 08/23/2020   GLUCOSE 96 02/28/2021   CHOL 154 08/23/2020   TRIG 106 08/23/2020   HDL 43 08/23/2020   LDLCALC 91 08/23/2020   ALT 20 08/23/2020   AST 18 08/23/2020   NA 140 02/28/2021   K 4.2 02/28/2021   CL 105  02/28/2021   CREATININE 1.05 02/28/2021   BUN 11 02/28/2021   CO2 28 02/28/2021   TSH 2.45 08/23/2020   PSA 1.9 08/23/2020   HGBA1C 5.8 02/28/2021    Lab Results  Component Value Date   TSH 2.45 08/23/2020   Lab Results  Component Value Date   WBC 5.5 08/23/2020   HGB 14.8 08/23/2020   HCT 44.0 08/23/2020   MCV 87.8 08/23/2020   PLT 226 08/23/2020   Lab Results  Component Value Date   NA 140 02/28/2021   K 4.2 02/28/2021   CO2 28 02/28/2021   GLUCOSE 96 02/28/2021   BUN 11 02/28/2021   CREATININE 1.05 02/28/2021   BILITOT 0.6 08/23/2020   ALKPHOS 95 12/06/2019   AST 18 08/23/2020   ALT 20 08/23/2020   PROT 6.8 08/23/2020   ALBUMIN 4.4 12/06/2019   CALCIUM 9.4 02/28/2021   GFR 77.83 02/28/2021   Lab Results  Component Value Date   CHOL 154 08/23/2020   Lab Results  Component Value Date   HDL 43 08/23/2020   Lab Results  Component Value Date   LDLCALC 91 08/23/2020   Lab Results  Component Value Date   TRIG 106 08/23/2020   Lab Results  Component Value Date   CHOLHDL 3.6 08/23/2020   Lab Results  Component Value Date   HGBA1C 5.8 02/28/2021       Assessment & Plan:   Problem List Items Addressed This Visit   None   No orders of the defined types were placed in this encounter.   I,Francisco Carter,acting as a Education administrator for [employer], NP.,have documented all relevant documentation on the behalf of Francisco Pear, NP,as directed by  Francisco Pear, NP while in the presence of Francisco Pear, NP.   I, Francisco Alar, NP , personally preformed the services described in this documentation.  All medical record entries made by the scribe were at my direction and in my presence.  I have reviewed the chart and discharge instructions (if applicable) and agree that the record reflects my personal performance and is accurate and complete. 01/26/2022

## 2022-02-14 ENCOUNTER — Other Ambulatory Visit: Payer: Self-pay | Admitting: Family

## 2022-02-23 ENCOUNTER — Telehealth: Payer: Self-pay

## 2022-02-23 ENCOUNTER — Other Ambulatory Visit: Payer: Self-pay

## 2022-02-23 MED ORDER — AMLODIPINE BESYLATE 5 MG PO TABS: 5.0000 mg | ORAL_TABLET | Freq: Every day | ORAL | 0 refills | Status: DC

## 2022-02-23 NOTE — Telephone Encounter (Signed)
Rx sent for another 7 days ?

## 2022-02-23 NOTE — Telephone Encounter (Signed)
Pt called to schedule an appt for med refill and I advised pt to be sure to keep this appt as he has had three no shows in the past dating back to Sept 2022.  Pt wanted to know if he could get a refill on BP Med until he comes in next Monday for his appt with PCP, I advised that would be up to her and based on no shows it may be unlikely. ?

## 2022-03-02 ENCOUNTER — Ambulatory Visit (INDEPENDENT_AMBULATORY_CARE_PROVIDER_SITE_OTHER): Payer: PRIVATE HEALTH INSURANCE | Admitting: Family

## 2022-03-02 VITALS — BP 142/90 | HR 82 | Temp 98.4°F | Resp 16 | Ht 72.0 in | Wt 199.0 lb

## 2022-03-02 DIAGNOSIS — G4733 Obstructive sleep apnea (adult) (pediatric): Secondary | ICD-10-CM

## 2022-03-02 DIAGNOSIS — I10 Essential (primary) hypertension: Secondary | ICD-10-CM | POA: Diagnosis not present

## 2022-03-02 DIAGNOSIS — Z1211 Encounter for screening for malignant neoplasm of colon: Secondary | ICD-10-CM

## 2022-03-02 DIAGNOSIS — M79605 Pain in left leg: Secondary | ICD-10-CM | POA: Insufficient documentation

## 2022-03-02 DIAGNOSIS — R739 Hyperglycemia, unspecified: Secondary | ICD-10-CM | POA: Diagnosis not present

## 2022-03-02 DIAGNOSIS — L723 Sebaceous cyst: Secondary | ICD-10-CM | POA: Diagnosis not present

## 2022-03-02 LAB — COMPREHENSIVE METABOLIC PANEL
ALT: 20 U/L (ref 0–53)
AST: 20 U/L (ref 0–37)
Albumin: 4.3 g/dL (ref 3.5–5.2)
Alkaline Phosphatase: 89 U/L (ref 39–117)
BUN: 15 mg/dL (ref 6–23)
CO2: 28 mEq/L (ref 19–32)
Calcium: 9.6 mg/dL (ref 8.4–10.5)
Chloride: 105 mEq/L (ref 96–112)
Creatinine, Ser: 1.1 mg/dL (ref 0.40–1.50)
GFR: 73.08 mL/min (ref >60.00)
Glucose, Bld: 113 mg/dL — ABNORMAL HIGH (ref 70–99)
Potassium: 4.3 mEq/L (ref 3.5–5.1)
Sodium: 140 mEq/L (ref 135–145)
Total Bilirubin: 0.6 mg/dL (ref 0.2–1.2)
Total Protein: 6.9 g/dL (ref 6.0–8.3)

## 2022-03-02 LAB — HEMOGLOBIN A1C: Hgb A1c MFr Bld: 6.1 % (ref 4.6–6.5)

## 2022-03-02 MED ORDER — AMLODIPINE BESYLATE 5 MG PO TABS: 5.0000 mg | ORAL_TABLET | Freq: Every day | ORAL | 1 refills | Status: DC

## 2022-03-02 MED ORDER — MELOXICAM 7.5 MG PO TABS: 7.5000 mg | ORAL_TABLET | Freq: Every day | ORAL | 0 refills | Status: DC

## 2022-03-02 NOTE — Assessment & Plan Note (Signed)
Enlarging, will refer to plastic surgery to discuss excision.  ?

## 2022-03-02 NOTE — Assessment & Plan Note (Signed)
Uncontrolled off of amlodipine. Will restart.   ?

## 2022-03-02 NOTE — Progress Notes (Signed)
? ?Subjective:  ? ?By signing my name below, I, Shehryar Baig, attest that this documentation has been prepared under the direction and in the presence of Debbrah Alar, NP. 03/02/2022 ? ? ? Patient ID: Francisco Carter, male    DOB: [birth year], 61 y.o.   MRN: [id number] ? ?Chief Complaint  ?Patient presents with  ? Hypertension  ?  Here for follow up, has not been on amlodipine for about 1 week ?  ? ? ?HPI ?Patient is in today for a office visit.  ? ?Refills- He is requesting a refill for 5 mg amlodipine daily PO, ?Leg pain- He complains of pain in both his legs. His pain is worse in his left leg over his right. He denies having any lower back pain. He continues driving daily for work but notes he does not lift heavy object often.  ?Cyst- He reports the cyst on his forehead has increased in size since his last visit. He is interested in seeing a dermatologist to have it removed.  ?CPAP- He thinks his CPAP machine is malfunctioning while he is sleeping. He is interested in seeing a sleep specialist to troubleshoot his machine.  ?Immunizations- He does not have a flu vaccine this year. He reports having 4 Covid-19 vaccines but does not have the bivalent Covid-19 booster vaccine. .  ? ? ?Past Medical History:  ?Diagnosis Date  ? Arthritis   ? Chest pain 04/08/2013  ? ? ?Past Surgical History:  ?Procedure Laterality Date  ? ROTATOR CUFF REPAIR    ? left  ? ? ?Family History  ?Problem Relation Age of Onset  ? Cancer Mother   ?     bone  ? Hypertension Mother   ? Heart disease Father   ? Hypertension Father   ? Lupus Sister   ? COPD Brother   ? Kidney disease Maternal Uncle   ?     ?kidney failure  ? Colon cancer Neg Hx   ? Esophageal cancer Neg Hx   ? Rectal cancer Neg Hx   ? Prostate cancer Neg Hx   ? ? ?Social History  ? ?Socioeconomic History  ? Marital status: Married  ?  Spouse name: Not on file  ? Number of children: Not on file  ? Years of education: Not on file  ? Highest education level: Not on file   ?Occupational History  ? Not on file  ?Tobacco Use  ? Smoking status: Never  ? Smokeless tobacco: Never  ?Substance and Sexual Activity  ? Alcohol use: Yes  ?  Comment: occationally  ? Drug use: No  ? Sexual activity: Not on file  ?Other Topics Concern  ? Not on file  ?Social History Narrative  ? 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 2 children 17 and 30  ? Enjoys movies, visiting family.  ?   ?   ? ?Social Determinants of Health  ? ?Financial Resource Strain: Not on file  ?Food Insecurity: Not on file  ?Transportation Needs: Not on file  ?Physical Activity: Not on file  ?Stress: Not on file  ?Social Connections: Not on file  ?Intimate Partner Violence: Not on file  ? ? ?Outpatient Medications Prior to Visit  ?Medication Sig Dispense Refill  ? amLODipine (NORVASC) 5 MG tablet Take 1 tablet (5 mg total) by mouth daily. 7 tablet 0  ? ?No facility-administered medications prior to visit.  ? ? ?No Known Allergies ? ?Review of Systems  ?Musculoskeletal:  Negative for  back pain (lower).  ?     (+)bilateral leg pain, L>R  ?Skin:   ?     (+)growing cyst by hairline on forehead  ? ?   ?Objective:  ?  ?Physical Exam ?Constitutional:   ?   General: He is not in acute distress. ?   Appearance: Normal appearance. He is not ill-appearing.  ?HENT:  ?   Head: Normocephalic and atraumatic.  ?   Right Ear: External ear normal.  ?   Left Ear: External ear normal.  ?Eyes:  ?   Extraocular Movements: Extraocular movements intact.  ?   Pupils: Pupils are equal, round, and reactive to light.  ?Cardiovascular:  ?   Rate and Rhythm: Normal rate and regular rhythm.  ?   Heart sounds: Normal heart sounds. No murmur heard. ?  No gallop.  ?Pulmonary:  ?   Effort: Pulmonary effort is normal. No respiratory distress.  ?   Breath sounds: Normal breath sounds. No wheezing or rales.  ?Skin: ?   General: Skin is warm and dry.  ?   Comments: Sebaceous cyst noted by hairline on forehead  ?Neurological:  ?   Mental Status: He is alert and  oriented to person, place, and time.  ?Psychiatric:     ?   Judgment: Judgment normal.  ? ? ?BP (!) 142/90 (BP Location: Right Arm, Patient Position: Sitting, Cuff Size: Small)   Pulse 82   Temp 98.4 ?F (36.9 ?C) (Oral)   Resp 16   Ht 6' (1.829 m)   Wt 199 lb (90.3 kg)   SpO2 100%   BMI 26.99 kg/m?  ?Wt Readings from Last 3 Encounters:  ?03/02/22 199 lb (90.3 kg)  ?02/28/21 196 lb (88.9 kg)  ?08/23/20 199 lb (90.3 kg)  ? ? ?Diabetic Foot Exam - Simple   ?No data filed ?  ? ?Lab Results  ?Component Value Date  ? WBC 5.5 08/23/2020  ? HGB 14.8 08/23/2020  ? HCT 44.0 08/23/2020  ? PLT 226 08/23/2020  ? GLUCOSE 96 02/28/2021  ? CHOL 154 08/23/2020  ? TRIG 106 08/23/2020  ? HDL 43 08/23/2020  ? [place] 91 08/23/2020  ? ALT 20 08/23/2020  ? AST 18 08/23/2020  ? NA 140 02/28/2021  ? K 4.2 02/28/2021  ? CL 105 02/28/2021  ? CREATININE 1.05 02/28/2021  ? BUN 11 02/28/2021  ? CO2 28 02/28/2021  ? TSH 2.45 08/23/2020  ? PSA 1.9 08/23/2020  ? HGBA1C 5.8 02/28/2021  ? ? ?Lab Results  ?Component Value Date  ? TSH 2.45 08/23/2020  ? ?Lab Results  ?Component Value Date  ? WBC 5.5 08/23/2020  ? HGB 14.8 08/23/2020  ? HCT 44.0 08/23/2020  ? MCV 87.8 08/23/2020  ? PLT 226 08/23/2020  ? ?Lab Results  ?Component Value Date  ? NA 140 02/28/2021  ? K 4.2 02/28/2021  ? CO2 28 02/28/2021  ? GLUCOSE 96 02/28/2021  ? BUN 11 02/28/2021  ? CREATININE 1.05 02/28/2021  ? BILITOT 0.6 08/23/2020  ? ALKPHOS 95 12/06/2019  ? AST 18 08/23/2020  ? ALT 20 08/23/2020  ? PROT 6.8 08/23/2020  ? ALBUMIN 4.4 12/06/2019  ? CALCIUM 9.4 02/28/2021  ? GFR 77.83 02/28/2021  ? ?Lab Results  ?Component Value Date  ? CHOL 154 08/23/2020  ? ?Lab Results  ?Component Value Date  ? HDL 43 08/23/2020  ? ?Lab Results  ?Component Value Date  ? [place] 91 08/23/2020  ? ?Lab Results  ?Component Value Date  ?  TRIG 106 08/23/2020  ? ?Lab Results  ?Component Value Date  ? CHOLHDL 3.6 08/23/2020  ? ?Lab Results  ?Component Value Date  ? HGBA1C 5.8 02/28/2021  ? ? ?    ?Assessment & Plan:  ? ?Problem List Items Addressed This Visit   ? ?  ? Unprioritized  ? Sebaceous cyst  ?  Enlarging, will refer to plastic surgery to discuss excision.  ?  ?  ? Relevant Orders  ? Ambulatory referral to Plastic Surgery  ? Obstructive sleep apnea  ?  Needs a new machine, does not feel that it is working properly. Will refer to sleep specialist.  ?  ?  ? Relevant Orders  ? Ambulatory referral to Pulmonology  ? Left leg pain  ?  New. Sounds like he is having some lumbar radicular symptoms. Will give pt a short course of meloxicam.  ?  ?  ? Essential hypertension  ?  Uncontrolled off of amlodipine. Will restart.   ?  ?  ? Relevant Medications  ? amLODipine (NORVASC) 5 MG tablet  ? Other Relevant Orders  ? Comp Met (CMET)  ? ?Other Visit Diagnoses   ? ? Colon cancer screening    -  Primary  ? Relevant Orders  ? Ambulatory referral to Gastroenterology  ? Hyperglycemia      ? Relevant Orders  ? Hemoglobin A1c  ? ?  ? ? ? ?Meds ordered this encounter  ?Medications  ? amLODipine (NORVASC) 5 MG tablet  ?  Sig: Take 1 tablet (5 mg total) by mouth daily.  ?  Dispense:  90 tablet  ?  Refill:  1  ?  Order Specific Question:   Supervising Provider  ?  Answer:   Penni Homans A [[id number]]  ? meloxicam (MOBIC) 7.5 MG tablet  ?  Sig: Take 1 tablet (7.5 mg total) by mouth daily.  ?  Dispense:  14 tablet  ?  Refill:  0  ?  Order Specific Question:   Supervising Provider  ?  Answer:   Penni Homans A [[id number]]  ? ? ?I, Nance Pear, NP, personally preformed the services described in this documentation.  All medical record entries made by the scribe were at my direction and in my presence.  I have reviewed the chart and discharge instructions (if applicable) and agree that the record reflects my personal performance and is accurate and complete. 03/02/2022 ? ? ?Engineering geologist as a Education administrator for [employer], NP.,have documented all relevant documentation on the behalf of Nance Pear, NP,as  directed by  Nance Pear, NP while in the presence of Nance Pear, NP. ? ? ?Nance Pear, NP ? ?

## 2022-03-02 NOTE — Assessment & Plan Note (Signed)
Needs a new machine, does not feel that it is working properly. Will refer to sleep specialist.  ?

## 2022-03-02 NOTE — Patient Instructions (Signed)
Please get the bivalent covid booster. ?Complete lab work prior to leaving. ?Restart amlodipine.  ?

## 2022-03-02 NOTE — Assessment & Plan Note (Signed)
New. Sounds like he is having some lumbar radicular symptoms. Will give pt a short course of meloxicam.  ?

## 2022-03-30 ENCOUNTER — Institutional Professional Consult (permissible substitution): Payer: PRIVATE HEALTH INSURANCE | Admitting: Plastic Surgery

## 2022-04-01 ENCOUNTER — Ambulatory Visit (INDEPENDENT_AMBULATORY_CARE_PROVIDER_SITE_OTHER): Payer: PRIVATE HEALTH INSURANCE | Admitting: Family

## 2022-04-01 ENCOUNTER — Encounter: Payer: Self-pay | Admitting: Family

## 2022-04-01 VITALS — BP 122/85 | HR 67 | Temp 98.6°F | Resp 16 | Ht 69.0 in | Wt 198.0 lb

## 2022-04-01 DIAGNOSIS — Z125 Encounter for screening for malignant neoplasm of prostate: Secondary | ICD-10-CM | POA: Diagnosis not present

## 2022-04-01 DIAGNOSIS — Z0000 Encounter for general adult medical examination without abnormal findings: Secondary | ICD-10-CM | POA: Diagnosis not present

## 2022-04-01 DIAGNOSIS — R351 Nocturia: Secondary | ICD-10-CM | POA: Diagnosis not present

## 2022-04-01 DIAGNOSIS — Z1159 Encounter for screening for other viral diseases: Secondary | ICD-10-CM

## 2022-04-01 LAB — PSA: PSA: 2.84 ng/mL (ref 0.10–4.00)

## 2022-04-01 MED ORDER — TAMSULOSIN HCL 0.4 MG PO CAPS: 0.4000 mg | ORAL_CAPSULE | Freq: Every day | ORAL | 3 refills | Status: DC

## 2022-04-01 NOTE — Progress Notes (Signed)
?Subjective:  ? ?By signing my name below, I, Francisco Carter, attest that this documentation has been prepared under the direction and in the presence of Debbrah Alar, NP 04/01/2022  ? ? Patient ID: Francisco Carter, male    DOB: 10/07/1961, 61 y.o.   MRN: [id number] ? ?Chief Complaint  ?Patient presents with  ? Annual Exam  ?   ?  ? ? ?HPI ?Patient is in today for a comprehensive physical exam. ? ?Nocturia- He reports he often wakes up 2-3 times at night to urinate. Denies hematuria and trouble urinating.  ?PSA- Last checked on 08/23/2020. Reading was 1.9. Colonoscopy- Last completed on 03/04/2012. There was scattered diverticula and internal hemorrhoids. Repeat in 10 years. ?Immunizations- He has 3 Covid-19 vaccines at this time. He is UTD on tetanus and shingles vaccines. ?Diet and Exercise- He is trying to manage a healthy diet and is exercising by walking, ?Dental and Vision- He is not UTD on vision and dental ?Social History- He does not smoke ,use drugs or tobacco products. Occasionally drinks beer. ? ?He denies having any unexpected weight change, ear pain, hearing loss and rhinorrhea, visual disturbance, cough, chest pain and leg swelling, nausea, vomiting, diarrhea and blood in stool, or dysuria, for myalgias and arthralgias, rash, headaches, adenopathy, depression or anxiety at this time  ? ?Past Medical History:  ?Diagnosis Date  ? Arthritis   ? Chest pain 04/08/2013  ? ? ?Past Surgical History:  ?Procedure Laterality Date  ? ROTATOR CUFF REPAIR    ? left  ? ? ?Family History  ?Problem Relation Age of Onset  ? Cancer Mother   ?     bone  ? Hypertension Mother   ? Heart disease Father   ? Hypertension Father   ? Lupus Sister   ? COPD Brother   ? Kidney disease Maternal Uncle   ?     ?kidney failure  ? Colon cancer Neg Hx   ? Esophageal cancer Neg Hx   ? Rectal cancer Neg Hx   ? Prostate cancer Neg Hx   ? ? ?Social History  ? ?Socioeconomic History  ? Marital status: Married  ?  Spouse name: Not on file   ? Number of children: Not on file  ? Years of education: Not on file  ? Highest education level: Not on file  ?Occupational History  ? Not on file  ?Tobacco Use  ? Smoking status: Never  ? Smokeless tobacco: Never  ?Substance and Sexual Activity  ? Alcohol use: Yes  ?  Comment: occationally  ? Drug use: No  ? Sexual activity: Yes  ?  Partners: Female  ?Other Topics Concern  ? Not on file  ?Social History Narrative  ? 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 2 children 17 and 30  ? Enjoys movies, visiting family.  ?   ?   ? ?Social Determinants of Health  ? ?Financial Resource Strain: Not on file  ?Food Insecurity: Not on file  ?Transportation Needs: Not on file  ?Physical Activity: Not on file  ?Stress: Not on file  ?Social Connections: Not on file  ?Intimate Partner Violence: Not on file  ? ? ?Outpatient Medications Prior to Visit  ?Medication Sig Dispense Refill  ? amLODipine (NORVASC) 5 MG tablet Take 1 tablet (5 mg total) by mouth daily. 90 tablet 1  ? meloxicam (MOBIC) 7.5 MG tablet Take 1 tablet (7.5 mg total) by mouth daily. 14 tablet 0  ? ?No facility-administered  medications prior to visit.  ? ? ?No Known Allergies ? ?Review of Systems  ?Constitutional:  Negative for fever.  ?HENT:  Negative for ear pain and hearing loss.   ?     (-)nystagmus ?(-)adenopathy  ?Eyes:  Negative for blurred vision.  ?Respiratory:  Negative for cough, shortness of breath and wheezing.   ?Cardiovascular:  Negative for chest pain and leg swelling.  ?Gastrointestinal:  Negative for blood in stool, diarrhea, nausea and vomiting.  ?Genitourinary:  Positive for frequency. Negative for dysuria.  ?Musculoskeletal:  Negative for joint pain and myalgias.  ?Skin:  Negative for rash.  ?Neurological:  Negative for headaches.  ?Psychiatric/Behavioral:  Negative for depression. The patient is not nervous/anxious.   ? ?   ?Objective:  ?  ?Physical Exam ?Constitutional:   ?   General: He is not in acute distress. ?   Appearance: Normal  appearance.  ?HENT:  ?   Head: Normocephalic and atraumatic.  ?   Right Ear: Tympanic membrane, ear canal and external ear normal.  ?   Left Ear: Tympanic membrane, ear canal and external ear normal.  ?Cardiovascular:  ?   Rate and Rhythm: Normal rate and regular rhythm.  ?   Heart sounds: No murmur heard. ?Pulmonary:  ?   Effort: No respiratory distress.  ?   Breath sounds: Normal breath sounds. No wheezing or rales.  ?Musculoskeletal:  ?   Comments: 5/5 strength in upper and lower extremities   ?Skin: ?   General: Skin is warm and dry.  ?Neurological:  ?   Mental Status: He is alert and oriented to person, place, and time.  ?   Deep Tendon Reflexes:  ?   Reflex Scores: ?     Patellar reflexes are 2+ on the right side and 2+ on the left side. ?Psychiatric:     ?   Behavior: Behavior normal.     ?   Thought Content: Thought content normal.  ? ? ?BP 122/85 (BP Location: Right Arm, Patient Position: Sitting, Cuff Size: Small)   Pulse 67   Temp 98.6 ?F (37 ?C) (Oral)   Resp 16   Ht '5\' 9"'$  (1.753 m)   Wt 198 lb (89.8 kg)   SpO2 100%   BMI 29.24 kg/m?  ?Wt Readings from Last 3 Encounters:  ?04/01/22 198 lb (89.8 kg)  ?03/02/22 199 lb (90.3 kg)  ?02/28/21 196 lb (88.9 kg)  ? ? ?Diabetic Foot Exam - Simple   ?No data filed ?  ? ?Lab Results  ?Component Value Date  ? WBC 5.5 08/23/2020  ? HGB 14.8 08/23/2020  ? HCT 44.0 08/23/2020  ? PLT 226 08/23/2020  ? GLUCOSE 113 (H) 03/02/2022  ? CHOL 154 08/23/2020  ? TRIG 106 08/23/2020  ? HDL 43 08/23/2020  ? [place] 91 08/23/2020  ? ALT 20 03/02/2022  ? AST 20 03/02/2022  ? NA 140 03/02/2022  ? K 4.3 03/02/2022  ? CL 105 03/02/2022  ? CREATININE 1.10 03/02/2022  ? BUN 15 03/02/2022  ? CO2 28 03/02/2022  ? TSH 2.45 08/23/2020  ? PSA 1.9 08/23/2020  ? HGBA1C 6.1 03/02/2022  ? ? ?Lab Results  ?Component Value Date  ? TSH 2.45 08/23/2020  ? ?Lab Results  ?Component Value Date  ? WBC 5.5 08/23/2020  ? HGB 14.8 08/23/2020  ? HCT 44.0 08/23/2020  ? MCV 87.8 08/23/2020  ? PLT 226  08/23/2020  ? ?Lab Results  ?Component Value Date  ? NA 140 03/02/2022  ?  K 4.3 03/02/2022  ? CO2 28 03/02/2022  ? GLUCOSE 113 (H) 03/02/2022  ? BUN 15 03/02/2022  ? CREATININE 1.10 03/02/2022  ? BILITOT 0.6 03/02/2022  ? ALKPHOS 89 03/02/2022  ? AST 20 03/02/2022  ? ALT 20 03/02/2022  ? PROT 6.9 03/02/2022  ? ALBUMIN 4.3 03/02/2022  ? CALCIUM 9.6 03/02/2022  ? GFR 73.08 03/02/2022  ? ?Lab Results  ?Component Value Date  ? CHOL 154 08/23/2020  ? ?Lab Results  ?Component Value Date  ? HDL 43 08/23/2020  ? ?Lab Results  ?Component Value Date  ? [place] 91 08/23/2020  ? ?Lab Results  ?Component Value Date  ? TRIG 106 08/23/2020  ? ?Lab Results  ?Component Value Date  ? CHOLHDL 3.6 08/23/2020  ? ?Lab Results  ?Component Value Date  ? HGBA1C 6.1 03/02/2022  ? ? ?   ?Assessment & Plan:  ? ?Problem List Items Addressed This Visit   ? ?  ? Unprioritized  ? Preventative health care  ?  Discussed healthy diet, exercise, weight loss. Encouraged him to increase walking to 30 minutes 5 days a week. He will call to schedule his colonoscopy and get bivalent booster from the pharmacy. Check PSA.  Recommended that he schedule eye exam.   ? ?  ?  ? ?Other Visit Diagnoses   ? ? Need for hepatitis C screening test    -  Primary  ? Relevant Orders  ? Hepatitis C Antibody  ? Prostate cancer screening      ? Relevant Orders  ? PSA  ? ?  ? ? ? ?Meds ordered this encounter  ?Medications  ? tamsulosin (FLOMAX) 0.4 MG CAPS capsule  ?  Sig: Take 1 capsule (0.4 mg total) by mouth daily.  ?  Dispense:  30 capsule  ?  Refill:  3  ?  Order Specific Question:   Supervising Provider  ?  Answer:   Penni Homans A [[id number]]  ? ? ?I,Francisco Carter,acting as a Education administrator for [employer], NP.,have documented all relevant documentation on the behalf of Nance Pear, NP,as directed by  Nance Pear, NP while in the presence of Nance Pear, NP.  ? ?I, Debbrah Alar, NP , personally preformed the services described in this  documentation.  All medical record entries made by the scribe were at my direction and in my presence.  I have reviewed the chart and discharge instructions (if applicable) and agree that the record reflects my pers

## 2022-04-01 NOTE — Assessment & Plan Note (Signed)
Discussed healthy diet, exercise, weight loss. Encouraged him to increase walking to 30 minutes 5 days a week. He will call to schedule his colonoscopy and get bivalent booster from the pharmacy. Check PSA.  Recommended that he schedule eye exam.   ?

## 2022-04-01 NOTE — Patient Instructions (Addendum)
Please call [facility] to schedule your colonoscopy. [phone] ?Please schedule routine dental and vision visits. ?Try to walk 30 minutes 5 days a week.  ?Start flomax once daily to help with your night time bathroom trips.  ?

## 2022-04-01 NOTE — Assessment & Plan Note (Signed)
Will give trial of flomax.  ?

## 2022-04-02 LAB — HEPATITIS C ANTIBODY
Hepatitis C Ab: NONREACTIVE
SIGNAL TO CUT-OFF: 0.03 (ref <1.00)

## 2022-04-10 ENCOUNTER — Ambulatory Visit (INDEPENDENT_AMBULATORY_CARE_PROVIDER_SITE_OTHER): Payer: PRIVATE HEALTH INSURANCE | Admitting: Plastic Surgery

## 2022-04-10 ENCOUNTER — Encounter: Payer: Self-pay | Admitting: Plastic Surgery

## 2022-04-10 VITALS — BP 134/76 | HR 79 | Ht 69.0 in | Wt 190.0 lb

## 2022-04-10 DIAGNOSIS — D489 Neoplasm of uncertain behavior, unspecified: Secondary | ICD-10-CM

## 2022-04-10 NOTE — Progress Notes (Signed)
? ?  Referring Provider ?Debbrah Alar, NP ?[facility] ?[address] ?[place],  [place] [address]  ? ?CC:  ?Forehead cyst ? ? ?Francisco Carter is an 61 y.o. male.  ?HPI: Patient is a 61 year old with a forehead cyst that has been present for some time.  It is recently gotten bigger.  This been present for a couple years.  He was referred by his PCP.  He is interested in excision. ? ?No Known Allergies ? ?Outpatient Encounter Medications as of 04/10/2022  ?Medication Sig  ? amLODipine (NORVASC) 5 MG tablet Take 1 tablet (5 mg total) by mouth daily.  ? tamsulosin (FLOMAX) 0.4 MG CAPS capsule Take 1 capsule (0.4 mg total) by mouth daily. (Patient not taking: Reported on 04/10/2022)  ? ?No facility-administered encounter medications on file as of 04/10/2022.  ?  ? ?Past Medical History:  ?Diagnosis Date  ? Arthritis   ? Chest pain 04/08/2013  ? ? ?Past Surgical History:  ?Procedure Laterality Date  ? ROTATOR CUFF REPAIR    ? left  ? ? ?Family History  ?Problem Relation Age of Onset  ? Cancer Mother   ?     bone  ? Hypertension Mother   ? Heart disease Father   ? Hypertension Father   ? Lupus Sister   ? COPD Brother   ? Kidney disease Maternal Uncle   ?     ?kidney failure  ? Colon cancer Neg Hx   ? Esophageal cancer Neg Hx   ? Rectal cancer Neg Hx   ? Prostate cancer Neg Hx   ? ? ?Social History  ? ?Social History Narrative  ? 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 2 children 17 and 30  ? Enjoys movies, visiting family.  ?   ?   ?  ? ?Review of Systems ?General: Denies fevers, chills, weight loss ?CV: Denies chest pain, shortness of breath, palpitations ? ? ?Physical Exam ? ?  04/10/2022  ?  1:03 PM 04/01/2022  ?  9:08 AM 03/02/2022  ?  8:10 AM  ?Vitals with BMI  ?Height '5\' 9"'$  '5\' 9"'$  '6\' 0"'$   ?Weight 190 lbs 198 lbs 199 lbs  ?BMI 28.05 29.23 26.98  ?Systolic 132 440 102  ?Diastolic 76 85 90  ?Pulse 79 67 82  ?  ?General:  No acute distress,  Alert and oriented, Non-Toxic, Normal speech and affect ?HEENT: 2 x 2.5  cm cystic lesion forehead. ? ?Assessment/Plan ?Lesion is most likely a sebaceous cyst.  Excision under local is indicated. ? ?Lennice Sites ?04/10/2022, 1:35 PM  ? ? ?  ?

## 2022-04-15 ENCOUNTER — Ambulatory Visit (INDEPENDENT_AMBULATORY_CARE_PROVIDER_SITE_OTHER): Payer: PRIVATE HEALTH INSURANCE | Admitting: Pulmonary Disease

## 2022-04-15 ENCOUNTER — Encounter: Payer: Self-pay | Admitting: Pulmonary Disease

## 2022-04-15 VITALS — BP 126/82 | HR 77 | Temp 97.9°F | Ht 72.0 in | Wt 200.0 lb

## 2022-04-15 DIAGNOSIS — G4733 Obstructive sleep apnea (adult) (pediatric): Secondary | ICD-10-CM

## 2022-04-15 DIAGNOSIS — Z9989 Dependence on other enabling machines and devices: Secondary | ICD-10-CM

## 2022-04-15 NOTE — Patient Instructions (Addendum)
Prescription to DME company for CPAP supplies ? ?Prescription for new CPAP-auto CPAP 5-15 with heated humidification ? ?Continue using current machine ? ?You may need a new sleep study, this depends on insurance ? ?I will see you back in about 3 months ?-Want to see you after you have been back using the CPAP regularly ? ?Living With Sleep Apnea ?Sleep apnea is a condition in which breathing pauses or becomes shallow during sleep. Sleep apnea is most commonly caused by a collapsed or blocked airway. People with sleep apnea usually snore loudly. They may have times when they gasp and stop breathing for 10 seconds or more during sleep. This may happen many times during the night. ?The breaks in breathing also interrupt the deep sleep that you need to feel rested. Even if you do not completely wake up from the gaps in breathing, your sleep may not be restful and you feel tired during the day. You may also have a headache in the morning and low energy during the day, and you may feel anxious or depressed. ?How can sleep apnea affect me? ?Sleep apnea increases your chances of extreme tiredness during the day (daytime fatigue). It can also increase your risk for health conditions, such as: ?Heart attack. ?Stroke. ?Obesity. ?Type 2 diabetes. ?Heart failure. ?Irregular heartbeat. ?High blood pressure. ?If you have daytime fatigue as a result of sleep apnea, you may be more likely to: ?Perform poorly at school or work. ?Fall asleep while driving. ?Have difficulty with attention. ?Develop depression or anxiety. ?Have sexual dysfunction. ?What actions can I take to manage sleep apnea? ?Sleep apnea treatment ? ?If you were given a device to open your airway while you sleep, use it only as told by your health care provider. You may be given: ?An oral appliance. This is a custom-made mouthpiece that shifts your lower jaw forward. ?A continuous positive airway pressure (CPAP) device. This device blows air through a mask when you  breathe out (exhale). ?A nasal expiratory positive airway pressure (EPAP) device. This device has valves that you put into each nostril. ?A bi-level positive airway pressure (BIPAP) device. This device blows air through a mask when you breathe in (inhale) and breathe out (exhale). ?You may need surgery if other treatments do not work for you. ?Sleep habits ?Go to sleep and wake up at the same time every day. This helps set your internal clock (circadian rhythm) for sleeping. ?If you stay up later than usual, such as on weekends, try to get up in the morning within 2 hours of your normal wake time. ?Try to get at least 7-9 hours of sleep each night. ?Stop using a computer, tablet, and mobile phone a few hours before bedtime. ?Do not take long naps during the day. If you nap, limit it to 30 minutes. ?Have a relaxing bedtime routine. Reading or listening to music may relax you and help you sleep. ?Use your bedroom only for sleep. ?Keep your television and computer out of your bedroom. ?Keep your bedroom cool, dark, and quiet. ?Use a supportive mattress and pillows. ?Follow your health care provider's instructions for other changes to sleep habits. ?Nutrition ?Do not eat heavy meals in the evening. ?Do not have caffeine in the later part of the day. The effects of caffeine can last for more than 5 hours. ?Follow your health care provider's or dietitian's instructions for any diet changes. ?Lifestyle ? ?  ? ?Do not drink alcohol before bedtime. Alcohol can cause you to fall asleep  at first, but then it can cause you to wake up in the middle of the night and have trouble getting back to sleep. ?Do not use any products that contain nicotine or tobacco. These products include cigarettes, chewing tobacco, and vaping devices, such as e-cigarettes. If you need help quitting, ask your health care provider. ?Medicines ?Take over-the-counter and prescription medicines only as told by your health care provider. ?Do not use  over-the-counter sleep medicine. You can become dependent on this medicine, and it can make sleep apnea worse. ?Do not use medicines, such as sedatives and narcotics, unless told by your health care provider. ?Activity ?Exercise on most days, but avoid exercising in the evening. Exercising near bedtime can interfere with sleeping. ?If possible, spend time outside every day. Natural light helps regulate your circadian rhythm. ?General information ?Lose weight if you need to, and maintain a healthy weight. ?Keep all follow-up visits. This is important. ?If you are having surgery, make sure to tell your health care provider that you have sleep apnea. You may need to bring your device with you. ?Where to find more information ?Learn more about sleep apnea and daytime fatigue from: ?American Sleep Association: sleepassociation.org ?National Sleep Foundation: sleepfoundation.org ?National Heart, Lung, and Blood Institute: https://www.hartman-hill.biz/ ?Summary ?Sleep apnea is a condition in which breathing pauses or becomes shallow during sleep. ?Sleep apnea can cause daytime fatigue and other serious health conditions. ?You may need to wear a device while sleeping to help keep your airway open. ?If you are having surgery, make sure to tell your health care provider that you have sleep apnea. You may need to bring your device with you. ?Making changes to sleep habits, diet, lifestyle, and activity can help you manage sleep apnea. ?This information is not intended to replace advice given to you by your health care provider. Make sure you discuss any questions you have with your health care provider. ?Document Revised: 07/09/2021 Document Reviewed: 11/08/2020 ?Elsevier Patient Education ? [place]. ? ?

## 2022-04-15 NOTE — Progress Notes (Signed)
No new CT scan findings       ? ?      ?CINDY BRINDISI    [id number]    [birth year] ? ?Primary Care Physician:O'Sullivan, Lenna Sciara, NP ? ?Referring Physician: Debbrah Alar, NP ?[facility] ?[address] ?[place],  [place] [address] ? ?Chief complaint:   ?Patient with a history of obstructive sleep apnea ? ?HPI: ? ?Sleep apnea diagnosed about 2014 ?Diagnosed with severe obstructive sleep apnea ?Has been using CPAP regularly ? ?Has been having more issues with his CPAP recently, does not feel it is working well ? ?I reviewed his old study showing AHI over 65 ?Titrated to CPAP of 11 ?Ordered auto CPAP 5-15 ? ?Goes to sleep about 11-12, may take him about 1 to 2 hours to fall asleep, about 2 awakenings, final wake up time about 8 AM ? ?His weight has been relatively stable to higher since his last study ?Was 176 at his last study 9 years ago, currently about 200 pounds ? ?Denies significant sleepiness during the day ?History of hypertension-well-controlled ? ?No family history of sleep apnea ?Non-smoker ? ? ?Outpatient Encounter Medications as of 04/15/2022  ?Medication Sig  ? amLODipine (NORVASC) 5 MG tablet Take 1 tablet (5 mg total) by mouth daily.  ? tamsulosin (FLOMAX) 0.4 MG CAPS capsule Take 1 capsule (0.4 mg total) by mouth daily.  ? ?No facility-administered encounter medications on file as of 04/15/2022.  ? ? ?Allergies as of 04/15/2022  ? (No Known Allergies)  ? ? ?Past Medical History:  ?Diagnosis Date  ? Arthritis   ? Chest pain 04/08/2013  ? ? ?Past Surgical History:  ?Procedure Laterality Date  ? ROTATOR CUFF REPAIR    ? left  ? ? ?Family History  ?Problem Relation Age of Onset  ? Cancer Mother   ?     bone  ? Hypertension Mother   ? Heart disease Father   ? Hypertension Father   ? Lupus Sister   ? COPD Brother   ? Kidney disease Maternal Uncle   ?     ?kidney failure  ? Colon cancer Neg Hx   ? Esophageal cancer Neg Hx   ? Rectal cancer Neg Hx   ? Prostate cancer Neg Hx   ? ? ?Social History   ? ?Socioeconomic History  ? Marital status: Married  ?  Spouse name: Not on file  ? Number of children: Not on file  ? Years of education: Not on file  ? Highest education level: Not on file  ?Occupational History  ? Not on file  ?Tobacco Use  ? Smoking status: Never  ? Smokeless tobacco: Never  ?Substance and Sexual Activity  ? Alcohol use: Yes  ?  Comment: occationally  ? Drug use: No  ? Sexual activity: Yes  ?  Partners: Female  ?Other Topics Concern  ? Not on file  ?Social History Narrative  ? 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 2 children 17 and 30  ? Enjoys movies, visiting family.  ?   ?   ? ?Social Determinants of Health  ? ?Financial Resource Strain: Not on file  ?Food Insecurity: Not on file  ?Transportation Needs: Not on file  ?Physical Activity: Not on file  ?Stress: Not on file  ?Social Connections: Not on file  ?Intimate Partner Violence: Not on file  ? ? ?Review of Systems  ?Constitutional:  Negative for fatigue.  ?Respiratory:  Positive for apnea.   ?Psychiatric/Behavioral:  Positive for sleep disturbance.   ? ?Vitals:  ? 04/15/22 1404  ?BP: 126/82  ?Pulse: 77  ?Temp: 97.9 ?F (36.6 ?C)  ?SpO2: 96%  ? ? ? ?Physical Exam ?Constitutional:   ?   Appearance: Normal appearance.  ?HENT:  ?   Nose: Nose normal.  ?   Mouth/Throat:  ?   Mouth: Mucous membranes are moist.  ?   Comments: Mallampati 3, crowded oropharynx ?Cardiovascular:  ?   Rate and Rhythm: Normal rate and regular rhythm.  ?   Heart sounds: No murmur heard. ?  No friction rub.  ?Pulmonary:  ?   Effort: No respiratory distress.  ?   Breath sounds: No stridor. No wheezing or rhonchi.  ?Musculoskeletal:  ?   Cervical back: No rigidity.  ?Neurological:  ?   Mental Status: He is alert.  ?Psychiatric:     ?   Mood and Affect: Mood normal.  ? ? ?  04/15/2022  ?  2:00 PM  ?Results of the Epworth flowsheet  ?Sitting and reading 2  ?Watching TV 3  ?Sitting, inactive in a public place (e.g. a theatre or a meeting) 0  ?As a passenger in a car  for an hour without a break 2  ?Lying down to rest in the afternoon when circumstances permit 3  ?Sitting and talking to someone 0  ?Sitting quietly after a lunch without alcohol 0  ?In a car, while stopped for a few minutes in traffic 0  ?Total score 10  ? ?Data Reviewed: ?Previous sleep study reviewed 2014 ?-Home sleep study with severe obstructive sleep apnea ?-Titration study, titrated to CPAP of 11 ? ?His previous study was personally reviewed ? ?Compliance data not available at present ? ?Assessment:  ?Known obstructive sleep apnea ? ?Daytime sleepiness ? ?Pathophysiology of sleep disordered breathing discussed with patient ?Treatment options discussed with the patient ? ?Machine is less efficient than he was previously ?-He feels he may need new supplies ? ?Hypertension ?-Well-controlled ? ?Plan/Recommendations: ?Prescription for new supplies to DME company ? ?Prescription for new auto CPAP 5-15 with heated humidification to medical supply company ? ?Encouraged to continue using current machine as able ? ?Follow-up in about 3 months, we will try to see him after he gets back on using CPAP regularly ? ?Sherrilyn Rist MD ?[facility] ?04/15/2022, 2:40 PM ? ?CC: Debbrah Alar, NP ? ? ?

## 2022-04-17 ENCOUNTER — Encounter: Payer: Self-pay | Admitting: Pulmonary Disease

## 2022-05-04 ENCOUNTER — Encounter: Payer: Self-pay | Admitting: Gastroenterology

## 2022-05-06 ENCOUNTER — Encounter: Payer: Self-pay | Admitting: Gastroenterology

## 2022-05-08 ENCOUNTER — Ambulatory Visit (INDEPENDENT_AMBULATORY_CARE_PROVIDER_SITE_OTHER): Payer: PRIVATE HEALTH INSURANCE | Admitting: Plastic Surgery

## 2022-05-08 ENCOUNTER — Other Ambulatory Visit (HOSPITAL_COMMUNITY)
Admission: RE | Admit: 2022-05-08 | Discharge: 2022-05-08 | Disposition: A | Discharge status: Home / Self Care | Payer: No Typology Code available for payment source | Source: Ambulatory Visit | Attending: Plastic Surgery | Admitting: Plastic Surgery

## 2022-05-08 VITALS — BP 146/93 | HR 86 | Temp 97.9°F | Ht 69.0 in | Wt 200.0 lb

## 2022-05-08 DIAGNOSIS — D489 Neoplasm of uncertain behavior, unspecified: Secondary | ICD-10-CM | POA: Insufficient documentation

## 2022-05-08 DIAGNOSIS — L988 Other specified disorders of the skin and subcutaneous tissue: Secondary | ICD-10-CM | POA: Diagnosis not present

## 2022-05-08 NOTE — Progress Notes (Signed)
Operative Note   DATE OF OPERATION: 05/08/2022  LOCATION:    SURGICAL DEPARTMENT: Plastic Surgery  PREOPERATIVE DIAGNOSES: Right forehead lesion  POSTOPERATIVE DIAGNOSES:  same  PROCEDURE:  Excision of right forehead lesion measuring 2.4 cm Intermediate closure measuring 2.4 cm  SURGEON: Serenity Batley P. Rosa Gambale, MD  ANESTHESIA:  Local  COMPLICATIONS: None.   INDICATIONS FOR PROCEDURE:  The patient, Francisco Carter is a 61 y.o. male born on [birth year], is here for treatment of right forehead lesion. MRN: [id number]  CONSENT:  Informed consent was obtained directly from the patient. Risks, benefits and alternatives were fully discussed. Specific risks including but not limited to bleeding, infection, hematoma, seroma, scarring, pain, infection, wound healing problems, and need for further surgery were all discussed. The patient did have an ample opportunity to have questions answered to satisfaction.   DESCRIPTION OF PROCEDURE:  Local anesthesia was administered. The patient's operative site was prepped and draped in a sterile fashion. A time out was performed and all information was confirmed to be correct.  The lesion was excised with a 15 blade.  Hemostasis was obtained.  Circumferential undermining was performed and the skin was advanced and closed in layers with interrupted buried Monocryl sutures and Prolene for the skin.  The lesion excised measured 2.4 cm, and the total length of closure measured 2.4 cm.    The patient tolerated the procedure well.  There were no complications.

## 2022-05-12 LAB — SURGICAL PATHOLOGY

## 2022-05-15 ENCOUNTER — Encounter: Payer: Self-pay | Admitting: Plastic Surgery

## 2022-05-15 ENCOUNTER — Ambulatory Visit (INDEPENDENT_AMBULATORY_CARE_PROVIDER_SITE_OTHER): Payer: PRIVATE HEALTH INSURANCE | Admitting: Plastic Surgery

## 2022-05-15 DIAGNOSIS — D489 Neoplasm of uncertain behavior, unspecified: Secondary | ICD-10-CM

## 2022-05-15 DIAGNOSIS — L723 Sebaceous cyst: Secondary | ICD-10-CM

## 2022-05-15 NOTE — Progress Notes (Signed)
Status post excision sebaceous cyst, postoperative day 7  Physical exam Incisions clean dry intact, good initial cosmetic result with incision in the hairline  Pathology: Sebaceous cyst  Assessment and plan Patient doing well after excision sebaceous cyst, will follow-up as needed.

## 2022-06-03 ENCOUNTER — Telehealth: Payer: Self-pay | Admitting: *Deleted

## 2022-06-03 NOTE — Telephone Encounter (Signed)
Pt a no show for 10am PV appt. Called pt, left message asking pt to call back by 5pm today to reschedule PV, otherwise colonoscopy would be cancelled.

## 2022-06-03 NOTE — Telephone Encounter (Signed)
Pt did not call back to reschedule PV, PV and colon cancelled. No show letter sent by mail and mychart.

## 2022-06-30 ENCOUNTER — Other Ambulatory Visit: Payer: Self-pay | Admitting: Family

## 2022-07-01 ENCOUNTER — Encounter: Payer: PRIVATE HEALTH INSURANCE | Admitting: Gastroenterology

## 2022-08-03 ENCOUNTER — Ambulatory Visit: Payer: Self-pay | Admitting: Family

## 2022-08-05 ENCOUNTER — Telehealth: Payer: Self-pay | Admitting: Pulmonary Disease

## 2022-08-05 NOTE — Telephone Encounter (Signed)
I called the patient and left a message for him to call back as we will need to get him rescheduled?

## 2022-08-07 ENCOUNTER — Ambulatory Visit: Payer: PRIVATE HEALTH INSURANCE | Admitting: Pulmonary Disease

## 2022-08-26 ENCOUNTER — Other Ambulatory Visit: Payer: Self-pay | Admitting: Family

## 2022-08-26 DIAGNOSIS — I10 Essential (primary) hypertension: Secondary | ICD-10-CM

## 2022-09-14 ENCOUNTER — Encounter: Payer: Self-pay | Admitting: Family

## 2022-09-14 ENCOUNTER — Ambulatory Visit (INDEPENDENT_AMBULATORY_CARE_PROVIDER_SITE_OTHER): Payer: Self-pay | Admitting: Family

## 2022-09-14 DIAGNOSIS — I10 Essential (primary) hypertension: Secondary | ICD-10-CM

## 2022-09-14 DIAGNOSIS — R351 Nocturia: Secondary | ICD-10-CM

## 2022-09-14 NOTE — Progress Notes (Signed)
Subjective:   By signing my name below, I, Shehryar Baig, attest that this documentation has been prepared under the direction and in the presence of Debbrah Alar, NP. 09/14/2022      Patient ID: Francisco Carter, male    DOB: [birth year], 61 y.o.   MRN: [id number]  Chief Complaint  Patient presents with   Follow-up    Bp refill     HPI Patient is in today for a follow up visit.   Hypertension- He has not taken his 5 mg amlodipine since Friday due to losing it. His blood pressure is high during this visit. BP Readings from Last 3 Encounters:  09/14/22 (!) 142/85  05/08/22 (!) 146/93  04/15/22 126/82   Pulse Readings from Last 3 Encounters:  09/14/22 88  05/08/22 86  04/15/22 77   Flomax- He stopped taking Flomax due to feeling worse while taking it. He has no recent issues with urination while not taking it. He gets up 2-3 times a night to urinate.   CPAP- He continues wearing his CPAP machine regularly. He has a follow up appointment with his sleep specialist this month.   Immunizations- He is interested in receiving the flu vaccine. He was recommended to receive the new Covid-19 booster vaccine.    Health Maintenance Due  Topic Date Due   COVID-19 Vaccine (4 - Pfizer series) 03/28/2021   COLONOSCOPY (Pts 45-23yr Insurance coverage will need to be confirmed)  03/04/2022   INFLUENZA VACCINE  07/14/2022    Past Medical History:  Diagnosis Date   Arthritis    Chest pain 04/08/2013    Past Surgical History:  Procedure Laterality Date   ROTATOR CUFF REPAIR     left    Family History  Problem Relation Age of Onset   Cancer Mother        bone   Hypertension Mother    Heart disease Father    Hypertension Father    Lupus Sister    COPD Brother    Kidney disease Maternal Uncle        ?kidney failure   Colon cancer Neg Hx    Esophageal cancer Neg Hx    Rectal cancer Neg Hx    Prostate cancer Neg Hx     Social History   Socioeconomic History    Marital status: Married    Spouse name: Not on file   Number of children: Not on file   Years of education: Not on file   Highest education level: Not on file  Occupational History   Not on file  Tobacco Use   Smoking status: Never   Smokeless tobacco: Never  Substance and Sexual Activity   Alcohol use: Yes    Comment: occationally   Drug use: No   Sexual activity: Yes    Partners: Female  Other Topics Concern   Not on file  Social History Narrative   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         Social Determinants of Health   Financial Resource Strain: Not on file  Food Insecurity: Not on file  Transportation Needs: Not on file  Physical Activity: Not on file  Stress: Not on file  Social Connections: Not on file  Intimate Partner Violence: Not on file    Outpatient Medications Prior to Visit  Medication Sig Dispense Refill   amLODipine (NORVASC) 5 MG tablet TAKE 1  TABLET (5 MG TOTAL) BY MOUTH DAILY. 90 tablet 1   tamsulosin (FLOMAX) 0.4 MG CAPS capsule TAKE 1 CAPSULE BY MOUTH EVERY DAY (Patient not taking: Reported on 09/14/2022) 90 capsule 1   No facility-administered medications prior to visit.    No Known Allergies  ROS See HPI    Objective:    Physical Exam Constitutional:      General: He is not in acute distress.    Appearance: Normal appearance. He is not ill-appearing.  HENT:     Head: Normocephalic and atraumatic.     Right Ear: External ear normal.     Left Ear: External ear normal.  Eyes:     Extraocular Movements: Extraocular movements intact.     Pupils: Pupils are equal, round, and reactive to light.  Cardiovascular:     Rate and Rhythm: Normal rate and regular rhythm.     Heart sounds: Normal heart sounds. No murmur heard.    No gallop.     Comments: Blood pressure measured 142/85 during manual recheck. Pulmonary:     Effort: Pulmonary effort is normal. No respiratory  distress.     Breath sounds: Normal breath sounds. No wheezing or rales.  Skin:    General: Skin is warm and dry.  Neurological:     Mental Status: He is alert and oriented to person, place, and time.  Psychiatric:        Judgment: Judgment normal.     BP (!) 142/85   Pulse 88   Temp 98.7 F (37.1 C) (Oral)   Resp 18   Ht '5\' 9"'$  (1.753 m)   Wt 205 lb (93 kg)   SpO2 98%   BMI 30.27 kg/m  Wt Readings from Last 3 Encounters:  09/14/22 205 lb (93 kg)  05/08/22 200 lb (90.7 kg)  04/15/22 200 lb (90.7 kg)       Assessment & Plan:   Problem List Items Addressed This Visit       Unprioritized   Nocturia    X 2.  Stable. Declines flomax.      Essential hypertension    BP Readings from Last 3 Encounters:  09/14/22 (!) 140/86  05/08/22 (!) 146/93  04/15/22 126/82  BP up a bit.  Lost his medicine. He will see if he can pick up early and pay cash to restart amlodipine.         No orders of the defined types were placed in this encounter.   I, Nance Pear, NP, personally preformed the services described in this documentation.  All medical record entries made by the scribe were at my direction and in my presence.  I have reviewed the chart and discharge instructions (if applicable) and agree that the record reflects my personal performance and is accurate and complete. 09/14/2022   I,Shehryar Baig,acting as a scribe for Nance Pear, NP.,have documented all relevant documentation on the behalf of Nance Pear, NP,as directed by  Nance Pear, NP while in the presence of Nance Pear, NP.   Nance Pear, NP

## 2022-09-14 NOTE — Assessment & Plan Note (Addendum)
BP Readings from Last 3 Encounters:  09/14/22 (!) 140/86  05/08/22 (!) 146/93  04/15/22 126/82   BP up a bit.  Lost his medicine. He will see if he can pick up early and pay cash to restart amlodipine.

## 2022-09-14 NOTE — Assessment & Plan Note (Signed)
X 2.  Stable. Declines flomax.

## 2022-09-22 ENCOUNTER — Ambulatory Visit: Payer: Self-pay | Admitting: Pulmonary Disease

## 2022-09-30 ENCOUNTER — Telehealth: Payer: Self-pay

## 2022-09-30 NOTE — Telephone Encounter (Signed)
Will f/u with pt at his ov visit tomorrow, if pt calls back please ask if he can bring his SD card to his visit with Dr. Si Gaul tomorrow 10/01/22

## 2022-10-01 ENCOUNTER — Ambulatory Visit: Payer: Self-pay | Admitting: Pulmonary Disease

## 2022-10-16 ENCOUNTER — Telehealth: Payer: Self-pay

## 2022-10-19 ENCOUNTER — Ambulatory Visit: Payer: Self-pay | Admitting: Pulmonary Disease

## 2022-12-09 ENCOUNTER — Other Ambulatory Visit: Payer: Self-pay | Admitting: Family

## 2022-12-09 DIAGNOSIS — I10 Essential (primary) hypertension: Secondary | ICD-10-CM

## 2022-12-09 DIAGNOSIS — R079 Chest pain, unspecified: Secondary | ICD-10-CM | POA: Diagnosis not present

## 2023-01-07 NOTE — Telephone Encounter (Signed)
Nfn

## 2023-02-06 DIAGNOSIS — R0602 Shortness of breath: Secondary | ICD-10-CM | POA: Diagnosis not present

## 2023-02-06 DIAGNOSIS — R079 Chest pain, unspecified: Secondary | ICD-10-CM | POA: Diagnosis not present

## 2023-05-23 ENCOUNTER — Other Ambulatory Visit: Payer: Self-pay | Admitting: Family

## 2023-05-23 DIAGNOSIS — I10 Essential (primary) hypertension: Secondary | ICD-10-CM

## 2023-10-12 ENCOUNTER — Encounter: Payer: Self-pay | Admitting: Family

## 2023-11-09 ENCOUNTER — Encounter: Payer: 59 | Admitting: Family

## 2023-11-26 ENCOUNTER — Ambulatory Visit: Payer: 59 | Admitting: Family

## 2024-02-15 ENCOUNTER — Other Ambulatory Visit: Payer: Self-pay | Admitting: Family

## 2024-02-15 DIAGNOSIS — I10 Essential (primary) hypertension: Secondary | ICD-10-CM

## 2024-02-21 ENCOUNTER — Other Ambulatory Visit: Payer: Self-pay | Admitting: Family

## 2024-02-21 DIAGNOSIS — I10 Essential (primary) hypertension: Secondary | ICD-10-CM

## 2024-02-21 NOTE — Telephone Encounter (Signed)
 Last Fill: 05/24/23  Last OV: 09/14/22 Next OV: 03/24/24  Routing to provider for review/authorization.

## 2024-02-21 NOTE — Telephone Encounter (Signed)
 Copied from CRM [id number]. Topic: Clinical - Medication Refill >> Feb 21, 2024  2:24 PM Kathryne Eriksson wrote: Most Recent Primary Care Visit:  Provider: O'SULLIVAN, MELISSA  Department: [facility]  Visit Type: OFFICE VISIT  Date: 09/14/2022  Medication: amLODipine (NORVASC) 5 MG tablet  Has the patient contacted their pharmacy? Yes (Agent: If no, request that the patient contact the pharmacy for the refill. If patient does not wish to contact the pharmacy document the reason why and proceed with request.) (Agent: If yes, when and what did the pharmacy advise?)  Is this the correct pharmacy for this prescription? Yes If no, delete pharmacy and type the correct one.  This is the patient's preferred pharmacy:  [id number] - [place], [place] - [address] AT [facility] [address] [place] [address] Phone: [phone] Fax: [phone]   Has the prescription been filled recently? No  Is the patient out of the medication? Yes  Has the patient been seen for an appointment in the last year OR does the patient have an upcoming appointment? Yes  Can we respond through MyChart? Yes  Agent: Please be advised that Rx refills may take up to 3 business days. We ask that you follow-up with your pharmacy.

## 2024-03-17 ENCOUNTER — Other Ambulatory Visit: Payer: Self-pay | Admitting: Family

## 2024-03-17 DIAGNOSIS — I10 Essential (primary) hypertension: Secondary | ICD-10-CM

## 2024-03-24 ENCOUNTER — Encounter: Payer: Self-pay | Admitting: Family

## 2024-03-27 ENCOUNTER — Telehealth: Payer: Self-pay

## 2024-03-27 NOTE — Telephone Encounter (Signed)
 Patient notified his amlodipine prescription was sent in 03/17/24 for 90 tabs

## 2024-03-27 NOTE — Telephone Encounter (Signed)
 Initial Comment Caller states he needs a refill. Blood pressure medication. Translation No Nurse Assessment Nurse: Alvy Baar, RN, Debra Date/Time (Eastern Time): 03/26/2024 5:22:58 PM Confirm and document reason for call. If symptomatic, describe symptoms. ---caller states needs refill on htn med amlodipine 5mg  daily. is completely out. not able to check bp at this time. no symptoms, feels fine. last dose was yesterday. Does the patient have any new or worsening symptoms? ---Yes Will a triage be completed? ---Yes Related visit to physician within the last 2 weeks? ---No Does the PT have any chronic conditions? (i.e. diabetes, asthma, this includes High risk factors for pregnancy, etc.) ---Yes List chronic conditions. ---htn Is this a behavioral health or substance abuse call? ---No Nurse: Alvy Baar, RN, Debra Date/Time (Eastern Time): 03/26/2024 5:29:07 PM You have opened med assessment, do you wish to continue? ---Yes Please select the assessment type ---Refill Does the patient have enough medication to last until the office opens? ---No Additional Documentation ---recommended to check w/ pharmacy regarding emergency loaner dose and if unable to obtain loaner dose, please call back. verbalizes understanding. PLEASE NOTE: All timestamps contained within this report are represented as [place] Standard Time. CONFIDENTIALTY NOTICE: This fax transmission is intended only for the addressee. It contains information that is legally privileged, confidential or otherwise protected from use or disclosure. If you are not the intended recipient, you are strictly prohibited from reviewing, disclosing, copying using or disseminating any of this information or taking any action in reliance on or regarding this information. If you have received this fax in error, please notify us  immediately by telephone so that we can arrange for its return to us . Phone: [phone], Toll-Free: [phone], Fax:  [phone] WILLIAM_WESLEY 06/26/1961 Page: 2 of 2 CallId: [id number] Guidelines Guideline Title Affirmed Question Affirmed Notes Nurse Date/Time [facility] Time) Blood Pressure - High Ran out of BP medications Laurine Pore 03/26/2024 5:26:50 PM Disp. Time [facility] Time) Disposition Final User 03/26/2024 5:28:48 PM Call PCP within 24 Hours Yes Alvy Baar, RN, Abran Abrahams Final Disposition 03/26/2024 5:28:48 PM Call PCP within 24 Hours Yes Alvy Baar, RN, Lucian Rust Disagree/Comply Comply Caller Understands Yes PreDisposition InappropriateToAsk Care Advice Given Per Guideline CALL PCP WITHIN 24 HOURS: * You need to discuss this with your doctor (or NP/PA) within the next 24 hours. CALL BACK IF: * Weakness or numbness of the face, arm or leg on one side of the body occurs * Difficulty walking, difficulty talking, or severe headache occurs * Chest pain or difficulty breathing occurs * You become worse CARE ADVICE given per Blood Pressure - High (Adult) guideline

## 2024-07-25 ENCOUNTER — Ambulatory Visit (INDEPENDENT_AMBULATORY_CARE_PROVIDER_SITE_OTHER): Payer: Self-pay | Admitting: Family

## 2024-07-25 ENCOUNTER — Encounter: Payer: Self-pay | Admitting: Family

## 2024-07-25 VITALS — BP 125/78 | HR 70 | Temp 97.8°F | Resp 16 | Ht 69.0 in | Wt 202.0 lb

## 2024-07-25 DIAGNOSIS — Z0000 Encounter for general adult medical examination without abnormal findings: Secondary | ICD-10-CM

## 2024-07-25 DIAGNOSIS — L989 Disorder of the skin and subcutaneous tissue, unspecified: Secondary | ICD-10-CM | POA: Insufficient documentation

## 2024-07-25 DIAGNOSIS — B079 Viral wart, unspecified: Secondary | ICD-10-CM | POA: Diagnosis not present

## 2024-07-25 DIAGNOSIS — I10 Essential (primary) hypertension: Secondary | ICD-10-CM | POA: Diagnosis not present

## 2024-07-25 DIAGNOSIS — R739 Hyperglycemia, unspecified: Secondary | ICD-10-CM | POA: Diagnosis not present

## 2024-07-25 DIAGNOSIS — Z125 Encounter for screening for malignant neoplasm of prostate: Secondary | ICD-10-CM | POA: Diagnosis not present

## 2024-07-25 DIAGNOSIS — Z1211 Encounter for screening for malignant neoplasm of colon: Secondary | ICD-10-CM

## 2024-07-25 DIAGNOSIS — G4733 Obstructive sleep apnea (adult) (pediatric): Secondary | ICD-10-CM

## 2024-07-25 DIAGNOSIS — Z23 Encounter for immunization: Secondary | ICD-10-CM | POA: Diagnosis not present

## 2024-07-25 DIAGNOSIS — Z0001 Encounter for general adult medical examination with abnormal findings: Secondary | ICD-10-CM | POA: Diagnosis not present

## 2024-07-25 DIAGNOSIS — L723 Sebaceous cyst: Secondary | ICD-10-CM

## 2024-07-25 MED ORDER — AMLODIPINE BESYLATE 5 MG PO TABS: 5.0000 mg | ORAL_TABLET | Freq: Every day | ORAL | 1 refills | Status: DC

## 2024-07-25 NOTE — Assessment & Plan Note (Signed)
 Multiple warty lesions noted on face. Cosmetic appearance bothers him.  After discussing risks/benefits, pt gave verbal consent to proceed with cryotherapy. 6 lesions frozen using liquid nitrogen. Pt verbalizes understanding.

## 2024-07-25 NOTE — Assessment & Plan Note (Signed)
BP at goal, continue amlodipine '5mg'$ .

## 2024-07-25 NOTE — Patient Instructions (Signed)
 VISIT SUMMARY:  Today, you had a routine follow-up visit to manage your hypertension and other health concerns. We discussed your need for a new CPAP machine, reviewed your blood pressure medication, and addressed your prediabetes and skin warts. Additionally, we covered some general health maintenance items.  YOUR PLAN:  OBSTRUCTIVE SLEEP APNEA: You have been off your CPAP therapy for a few months and need a new machine. -We will refer you to a sleep specialist to discuss setting up a new CPAP machine.  ESSENTIAL HYPERTENSION: Your blood pressure is well-controlled with your current medication. -Continue taking amlodipine  as prescribed. -We have sent a prescription for [facility]lodipine  to [facility].  PREDIABETES: Your previous A1c level indicated prediabetes, and it has not been checked in two years. -We will order an A1c test to assess your current status.  BENIGN SKIN NEOPLASMS (FACIAL AND NECK WARTS/MOLES): You have multiple warts and moles on your face and neck, some of which have been treated before. -We performed cryotherapy on your facial and neck warts/moles today.  GENERAL HEALTH MAINTENANCE: You are due for several preventive care measures. -We will refer you for a colonoscopy at [facility]. -We will order a PSA test. -We administered a pneumonia vaccination today. -Please schedule a vision exam. -Please check your dental insurance and schedule a dental exam.

## 2024-07-25 NOTE — Assessment & Plan Note (Addendum)
 Had removed from upper forehead.

## 2024-07-25 NOTE — Assessment & Plan Note (Signed)
  He is due for several preventive care measures including colonoscopy, PSA test, pneumonia vaccination, vision exam, and dental care. - Prevnar 20 today.  - Order colonoscopy referral to  GI. - Order PSA test. - Administer pneumonia vaccination. - Advise him to schedule vision exam. - Advise him to check dental insurance and schedule dental exam.

## 2024-07-25 NOTE — Assessment & Plan Note (Signed)
 Wants to resume cpap- refer back to sleep specialist.

## 2024-07-25 NOTE — Progress Notes (Signed)
 Subjective:     Patient ID: Francisco Carter, male    DOB: [birth year], 63 y.o.   MRN: [id number]  Chief Complaint  Patient presents with   Annual Exam    HPI  Discussed the use of AI scribe software for clinical note transcription with the patient, who gave verbal consent to proceed.  History of Present Illness  .Francisco Carter is a 63 year old male with hypertension who presents for a routine follow-up visit.  He is currently taking amlodipine  for hypertension and is due for a refill. He has sleep apnea and seeks a new CPAP machine, having been off his previous machine for a few months. He experiences nocturia, urinating twice nightly with a strong stream and no difficulty. Occasional wrist pain is present without other joint or muscle pain. He feels overwhelmed at times due to financial and familial responsibilities but denies frequent headaches, depression, or anxiety. He remains physically active through work and is attempting to maintain a healthy diet. Family history includes lupus in his sister and COPD in his brother. No cold symptoms, leg swelling, skin rashes, digestive problems, or hearing and vision issues. Warts on his face and neck have been treated in the past. Lab Results  Component Value Date   PSA 2.84 04/01/2022   PSA 1.9 08/23/2020   PSA 1.67 12/06/2019         Health Maintenance Due  Topic Date Due   Pneumococcal Vaccine: 50+ Years (1 of 1 - PCV) Never done   Colonoscopy  03/04/2022   COVID-19 Vaccine (4 - 2024-25 season) 08/15/2023   INFLUENZA VACCINE  07/14/2024    Past Medical History:  Diagnosis Date   Arthritis    Chest pain 04/08/2013    Past Surgical History:  Procedure Laterality Date   ROTATOR CUFF REPAIR     left    Family History  Problem Relation Age of Onset   Cancer Mother        bone   Hypertension Mother    Heart disease Father    Hypertension Father    Lupus Sister    COPD Brother    Kidney disease Maternal Uncle         ?kidney failure   Colon cancer Neg Hx    Esophageal cancer Neg Hx    Rectal cancer Neg Hx    Prostate cancer Neg Hx     Social History   Socioeconomic History   Marital status: Married    Spouse name: Not on file   Number of children: Not on file   Years of education: Not on file   Highest education level: 12th grade  Occupational History   Not on file  Tobacco Use   Smoking status: Never   Smokeless tobacco: Never  Substance and Sexual Activity   Alcohol use: Yes    Comment: occationally   Drug use: No   Sexual activity: Yes    Partners: Female  Other Topics Concern   Not on file  Social History Narrative   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         Social Drivers of Health   Financial Resource Strain: Low Risk  (03/24/2024)   Overall Financial Resource Strain (CARDIA)    Difficulty of Paying Living Expenses: Not very hard  Food Insecurity: No Food Insecurity (03/24/2024)   Hunger Vital Sign    Worried About  Running Out of Food in the Last Year: Never true    Ran Out of Food in the Last Year: Never true  Transportation Needs: No Transportation Needs (03/24/2024)   PRAPARE - Administrator, Civil Service (Medical): No    Lack of Transportation (Non-Medical): No  Physical Activity: Sufficiently Active (03/24/2024)   Exercise Vital Sign    Days of Exercise per Week: 3 days    Minutes of Exercise per Session: 100 min  Stress: No Stress Concern Present (03/24/2024)   [employer] of Occupational Health - Occupational Stress Questionnaire    Feeling of Stress : Not at all  Social Connections: Moderately Integrated (03/24/2024)   Social Connection and Isolation Panel    Frequency of Communication with Friends and Family: Three times a week    Frequency of Social Gatherings with Friends and Family: Once a week    Attends Religious Services: 1 to 4 times per year    Active Member  of [employer] or Organizations: No    Attends Engineer, structural: Not on file    Marital Status: Married  Intimate Partner Violence: Unknown (03/16/2022)   Received from [facility]   HITS    Physically Hurt: Not on file    Insult or Talk Down To: Not on file    Threaten Physical Harm: Not on file    Scream or Curse: Not on file    Outpatient Medications Prior to Visit  Medication Sig Dispense Refill   amLODipine  (NORVASC ) 5 MG tablet TAKE 1 TABLET (5 MG TOTAL) BY MOUTH DAILY. 90 tablet 1   No facility-administered medications prior to visit.    No Known Allergies  Review of Systems  Constitutional:  Negative for weight loss.  HENT:  Negative for congestion and hearing loss.   Eyes:  Negative for blurred vision.  Respiratory:  Negative for cough.   Cardiovascular:  Negative for leg swelling.  Gastrointestinal:  Negative for constipation and diarrhea.  Genitourinary:  Negative for dysuria and frequency.  Musculoskeletal:  Positive for joint pain (some right wrist pain from driving truck). Negative for myalgias.  Skin:  Negative for rash.  Neurological:  Negative for headaches.  Psychiatric/Behavioral:  Negative for depression. The patient is not nervous/anxious.        Objective:    Physical Exam   BP 125/78 (BP Location: Right Arm, Patient Position: Sitting, Cuff Size: Normal)   Pulse 70   Temp 97.8 F (36.6 C) (Oral)   Resp 16   Ht 5' 9 (1.753 m)   Wt 202 lb (91.6 kg)   SpO2 99%   BMI 29.83 kg/m  Wt Readings from Last 3 Encounters:  07/25/24 202 lb (91.6 kg)  09/14/22 205 lb (93 kg)  05/08/22 200 lb (90.7 kg)   Physical Exam  Constitutional: He is oriented to person, place, and time. He appears well-developed and well-nourished. No distress.  HENT:  Head: Normocephalic and atraumatic.  Right Ear: Tympanic membrane and ear canal normal.  Left Ear: Tympanic membrane and ear canal normal.  Mouth/Throat: Oropharynx is clear and moist.  Eyes: Pupils  are equal, round, and reactive to light. No scleral icterus.  Neck: Normal range of motion. No thyromegaly present.  Cardiovascular: Normal rate and regular rhythm.   No murmur heard. Pulmonary/Chest: Effort normal and breath sounds normal. No respiratory distress. He has no wheezes. He has no rales. He exhibits no tenderness.  Abdominal: Soft. Bowel sounds are normal. He exhibits no distension and  no mass. There is no tenderness. There is no rebound and no guarding.  Musculoskeletal: He exhibits no edema.  Lymphadenopathy:    He has no cervical adenopathy.  Neurological: He is alert and oriented to person, place, and time. He has normal patellar reflexes. He exhibits normal muscle tone. Coordination normal.  Skin: Skin is warm and dry. Multiple filiform/warty growths noted on face Psychiatric: He has a normal mood and affect. His behavior is normal. Judgment and thought content normal.            Assessment & Plan:       Assessment & Plan:   Problem List Items Addressed This Visit       Unprioritized   Skin lesion   Multiple warty lesions noted on face. Cosmetic appearance bothers him.  After discussing risks/benefits, pt gave verbal consent to proceed with cryotherapy. 6 lesions frozen using liquid nitrogen. Pt verbalizes understanding.        RESOLVED: Sebaceous cyst   Had removed from upper forehead.      Preventative health care - Primary    He is due for several preventive care measures including colonoscopy, PSA test, pneumonia vaccination, vision exam, and dental care. - Prevnar 20 today.  - Order colonoscopy referral to [facility]. - Order PSA test. - Administer pneumonia vaccination. - Advise him to schedule vision exam. - Advise him to check dental insurance and schedule dental exam.      Relevant Orders   Lipid panel   Obstructive sleep apnea   Wants to resume cpap- refer back to sleep specialist.      Relevant Orders   Ambulatory referral to  Pulmonology   Essential hypertension   BP at goal, continue amlodipine  5 mg.       Relevant Medications   amLODipine  (NORVASC ) 5 MG tablet   Other Visit Diagnoses       Screening for colon cancer       Relevant Orders   Ambulatory referral to Gastroenterology     Screening for prostate cancer       Relevant Orders   PSA     Hyperglycemia       Relevant Orders   HgB A1c   Comp Met (CMET)   Lipid panel       I am having Elsie BRAVO. Ceesay maintain his amLODipine .  Meds ordered this encounter  Medications   amLODipine  (NORVASC ) 5 MG tablet    Sig: Take 1 tablet (5 mg total) by mouth daily.    Dispense:  90 tablet    Refill:  1    Supervising Provider:   DOMENICA BLACKBIRD A [[id number]]

## 2024-07-26 ENCOUNTER — Ambulatory Visit: Payer: Self-pay | Admitting: Family

## 2024-07-26 LAB — COMPREHENSIVE METABOLIC PANEL WITH GFR
ALT: 21 U/L (ref 0–53)
AST: 21 U/L (ref 0–37)
Albumin: 4.3 g/dL (ref 3.5–5.2)
Alkaline Phosphatase: 88 U/L (ref 39–117)
BUN: 15 mg/dL (ref 6–23)
CO2: 28 meq/L (ref 19–32)
Calcium: 9.4 mg/dL (ref 8.4–10.5)
Chloride: 105 meq/L (ref 96–112)
Creatinine, Ser: 1.2 mg/dL (ref 0.40–1.50)
GFR: 64.74 mL/min (ref >60.00)
Glucose, Bld: 85 mg/dL (ref 70–99)
Potassium: 4.3 meq/L (ref 3.5–5.1)
Sodium: 140 meq/L (ref 135–145)
Total Bilirubin: 0.7 mg/dL (ref 0.2–1.2)
Total Protein: 6.9 g/dL (ref 6.0–8.3)

## 2024-07-26 LAB — LIPID PANEL
Cholesterol: 165 mg/dL (ref 0–200)
HDL: 44.9 mg/dL (ref >39.00)
LDL Cholesterol: 95 mg/dL (ref 0–99)
NonHDL: 120.34
Total CHOL/HDL Ratio: 4
Triglycerides: 126 mg/dL (ref 0.0–149.0)
VLDL: 25.2 mg/dL (ref 0.0–40.0)

## 2024-07-26 LAB — PSA: PSA: 3.14 ng/mL (ref 0.10–4.00)

## 2024-07-26 LAB — HEMOGLOBIN A1C: Hgb A1c MFr Bld: 6.2 % (ref 4.6–6.5)

## 2024-08-07 DIAGNOSIS — B079 Viral wart, unspecified: Secondary | ICD-10-CM | POA: Insufficient documentation

## 2024-08-22 ENCOUNTER — Ambulatory Visit: Admitting: Pulmonary Disease

## 2024-09-14 ENCOUNTER — Encounter: Payer: Self-pay | Admitting: Gastroenterology

## 2024-10-05 ENCOUNTER — Other Ambulatory Visit: Payer: Self-pay | Admitting: Family

## 2024-10-05 DIAGNOSIS — I10 Essential (primary) hypertension: Secondary | ICD-10-CM

## 2024-10-05 NOTE — Telephone Encounter (Unsigned)
 Copied from CRM [id number]. Topic: Clinical - Medication Refill >> Oct 05, 2024  4:00 PM Sasha M wrote: Medication: amLODipine  (NORVASC ) 5 MG tablet Pt states he was told [facility] was sending his script at his last appt but he went to pick it up last night and pharmacy did not have it.   Has the patient contacted their pharmacy? Yes (Agent: If no, request that the patient contact the pharmacy for the refill. If patient does not wish to contact the pharmacy document the reason why and proceed with request.) (Agent: If yes, when and what did the pharmacy advise?) They had no meds for him to pick up  This is the patient's preferred pharmacy:  [id number] [place], [place] - [address] AT [facility] [address] [place] [address] Phone: [phone] Fax: [phone]  Is this the correct pharmacy for this prescription? Yes If no, delete pharmacy and type the correct one.   Has the prescription been filled recently? No  Is the patient out of the medication? No  Has the patient been seen for an appointment in the last year OR does the patient have an upcoming appointment? Yes  Can we respond through MyChart? Yes  Agent: Please be advised that Rx refills may take up to 3 business days. We ask that you follow-up with your pharmacy.

## 2024-10-06 MED ORDER — AMLODIPINE BESYLATE 5 MG PO TABS
5.0000 mg | ORAL_TABLET | Freq: Every day | ORAL | 1 refills | Status: AC
Start: 2024-10-06 — End: ?

## 2024-10-13 ENCOUNTER — Ambulatory Visit

## 2024-10-13 VITALS — Ht 69.0 in | Wt 185.0 lb

## 2024-10-13 DIAGNOSIS — Z1211 Encounter for screening for malignant neoplasm of colon: Secondary | ICD-10-CM

## 2024-10-13 MED ORDER — NA SULFATE-K SULFATE-MG SULF 17.5-3.13-1.6 GM/177ML PO SOLN: 1.0000 | Freq: Once | ORAL | 0 refills | Status: AC

## 2024-10-13 NOTE — Progress Notes (Signed)
 No egg or soy allergy known to patient  No issues known to pt with past sedation with any surgeries or procedures Patient denies ever being told they had issues or difficulty with intubation  No FH of Malignant Hyperthermia Pt is not on diet pills Pt is not on  home 02  Pt is not on blood thinners  Pt denies issues with constipation  No A fib or A flutter Have any cardiac testing pending--NO Pt can ambulate  Pt denies use of chewing tobacco Discussed diabetic I weight loss medication holds Discussed NSAID holds Checked BMI Pt instructed to use Singlecare.com or GoodRx for a price reduction on prep  Patient's chart reviewed  Pre visit completed

## 2024-10-27 ENCOUNTER — Encounter: Payer: Self-pay | Admitting: Gastroenterology

## 2024-10-27 ENCOUNTER — Ambulatory Visit: Admitting: Gastroenterology

## 2024-10-27 VITALS — BP 124/90 | HR 72 | Temp 97.7°F | Resp 17 | Ht 69.0 in | Wt 185.0 lb

## 2024-10-27 DIAGNOSIS — K5730 Diverticulosis of large intestine without perforation or abscess without bleeding: Secondary | ICD-10-CM | POA: Diagnosis not present

## 2024-10-27 DIAGNOSIS — K641 Second degree hemorrhoids: Secondary | ICD-10-CM

## 2024-10-27 DIAGNOSIS — D124 Benign neoplasm of descending colon: Secondary | ICD-10-CM | POA: Diagnosis not present

## 2024-10-27 DIAGNOSIS — Z1211 Encounter for screening for malignant neoplasm of colon: Secondary | ICD-10-CM | POA: Diagnosis present

## 2024-10-27 DIAGNOSIS — K648 Other hemorrhoids: Secondary | ICD-10-CM

## 2024-10-27 MED ORDER — SODIUM CHLORIDE 0.9 % IV SOLN: 500.0000 mL | INTRAVENOUS | Status: DC

## 2024-10-27 NOTE — Op Note (Signed)
 [facility] Patient Name: Francisco Carter Procedure Date: 10/27/2024 3:02 PM MRN: [id number] Endoscopist: Sandor Flatter , MD, [id number] Age: 63 Referring MD:  Date of Birth: 1961-12-04 Gender: Male Account #: [id number] Procedure:                Colonoscopy Indications:              Screening for colorectal malignant neoplasm (last                            colonoscopy was more than 10 years ago)                           Last colonoscopy was 02/2012 and notable for                            internal hemorrhoids, diverticulosis, with                            recommendation to repeat in 10 years. Medicines:                Monitored Anesthesia Care Procedure:                Pre-Anesthesia Assessment:                           - Prior to the procedure, a History and Physical                            was performed, and patient medications and                            allergies were reviewed. The patient's tolerance of                            previous anesthesia was also reviewed. The risks                            and benefits of the procedure and the sedation                            options and risks were discussed with the patient.                            All questions were answered, and informed consent                            was obtained. Prior Anticoagulants: The patient has                            taken no anticoagulant or antiplatelet agents. ASA                            Grade Assessment: II - A patient with mild systemic  disease. After reviewing the risks and benefits,                            the patient was deemed in satisfactory condition to                            undergo the procedure.                           After obtaining informed consent, the colonoscope                            was passed under direct vision. Throughout the                            procedure, the patient's blood pressure,  pulse, and                            oxygen saturations were monitored continuously. The                            Olympus Scope [id number] was introduced through the                            anus and advanced to the the cecum, identified by                            appendiceal orifice and ileocecal valve. The                            colonoscopy was performed without difficulty. The                            patient tolerated the procedure well. The quality                            of the bowel preparation was good. The ileocecal                            valve, appendiceal orifice, and rectum were                            photographed. Scope In: 3:07:36 PM Scope Out: 3:26:07 PM Scope Withdrawal Time: 0 hours 12 minutes 2 seconds  Total Procedure Duration: 0 hours 18 minutes 31 seconds  Findings:                 The perianal and digital rectal examinations were                            normal.                           A 4 mm polyp was found in the descending colon. The  polyp was sessile. The polyp was removed with a                            cold snare. Resection and retrieval were complete.                            Estimated blood loss was minimal.                           Multiple medium-mouthed and small-mouthed                            diverticula were found in the entire colon.                           Non-bleeding internal hemorrhoids were found during                            retroflexion. The hemorrhoids were medium-sized.                           The ascending colon revealed moderately excessive                            looping. Advancing the scope required using manual                            pressure and straightening and shortening the scope                            to obtain bowel loop reduction. Complications:            No immediate complications. Estimated Blood Loss:     Estimated blood loss was  minimal. Impression:               - One 4 mm polyp in the descending colon, removed                            with a cold snare. Resected and retrieved.                           - Diverticulosis in the entire examined colon.                           - Non-bleeding internal hemorrhoids.                           - There was significant looping of the colon. Recommendation:           - Patient has a contact number available for                            emergencies. The signs and symptoms of potential                            delayed  complications were discussed with the                            patient. Return to normal activities tomorrow.                            Written discharge instructions were provided to the                            patient.                           - Resume previous diet.                           - Continue present medications.                           - Await pathology results.                           - Repeat colonoscopy for surveillance based on                            pathology results.                           - Return to GI office PRN. Sandor Flatter, MD 10/27/2024 3:35:18 PM

## 2024-10-27 NOTE — Patient Instructions (Signed)

## 2024-10-27 NOTE — Progress Notes (Signed)
 Pt's states no medical or surgical changes since previsit or office visit.

## 2024-10-27 NOTE — Progress Notes (Signed)
 Transferred to PACU via stretcher.  Not responding to stimulation at this time.  VSS upon leaving procedure room.

## 2024-10-27 NOTE — Progress Notes (Signed)
 Called to room to assist during endoscopic procedure.  Patient ID and intended procedure confirmed with present staff. Received instructions for my participation in the procedure from the performing physician.

## 2024-10-27 NOTE — Progress Notes (Signed)
 GASTROENTEROLOGY PROCEDURE H&P NOTE   Primary Care Physician: Daryl Setter, NP    Reason for Procedure:  Colon Cancer screening  Plan:    Colonoscopy  Patient is appropriate for endoscopic procedure(s) in the ambulatory ([facility]) setting.  The nature of the procedure, as well as the risks, benefits, and alternatives were carefully and thoroughly reviewed with the patient. Ample time for discussion and questions allowed. The patient understood, was satisfied, and agreed to proceed. I personally addressed all patient questions and concerns.     HPI: Francisco Carter is a 63 y.o. male who presents for colonoscopy for routine Colon Cancer screening.  No active GI symptoms.  No known family history of colon cancer or related malignancy.  Patient is otherwise without complaints or active issues today.  Last colonoscopy was 02/2012 and notable for internal hemorrhoids, diverticulosis, with recommendation to repeat in 10 years.  Past Medical History:  Diagnosis Date   Arthritis    Chest pain 04/08/2013   Hypertension    Sleep apnea     Past Surgical History:  Procedure Laterality Date   ROTATOR CUFF REPAIR     left    Prior to Admission medications   Medication Sig Start Date End Date Taking? Authorizing Provider  amLODipine  (NORVASC ) 5 MG tablet Take 1 tablet (5 mg total) by mouth daily. 10/06/24  Yes Daryl Setter, NP    Current Outpatient Medications  Medication Sig Dispense Refill   amLODipine  (NORVASC ) 5 MG tablet Take 1 tablet (5 mg total) by mouth daily. 90 tablet 1   Current Facility-Administered Medications  Medication Dose Route Frequency Provider Last Rate Last Admin   0.9 %  sodium chloride  infusion  500 mL Intravenous Continuous Atharv Barriere V, DO        Allergies as of 10/27/2024   (No Known Allergies)    Family History  Problem Relation Age of Onset   Cancer Mother        bone   Hypertension Mother    Heart disease Father     Hypertension Father    Lupus Sister    COPD Brother    Kidney disease Maternal Uncle        ?kidney failure   Colon cancer Neg Hx    Esophageal cancer Neg Hx    Rectal cancer Neg Hx    Prostate cancer Neg Hx    Stomach cancer Neg Hx     Social History   Socioeconomic History   Marital status: Married    Spouse name: Not on file   Number of children: Not on file   Years of education: Not on file   Highest education level: 12th grade  Occupational History   Not on file  Tobacco Use   Smoking status: Never   Smokeless tobacco: Never  Vaping Use   Vaping status: Never Used  Substance and Sexual Activity   Alcohol use: Yes    Comment: occationally   Drug use: No   Sexual activity: Yes    Partners: Female  Other Topics Concern   Not on file  Social History Narrative   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         Social Drivers of Corporate Investment Banker Strain: Low Risk  (03/24/2024)   Overall Financial Resource Strain (CARDIA)    Difficulty of Paying Living Expenses: Not very hard  Food Insecurity: No  Food Insecurity (03/24/2024)   Hunger Vital Sign    Worried About Running Out of Food in the Last Year: Never true    Ran Out of Food in the Last Year: Never true  Transportation Needs: No Transportation Needs (03/24/2024)   PRAPARE - Administrator, Civil Service (Medical): No    Lack of Transportation (Non-Medical): No  Physical Activity: Sufficiently Active (03/24/2024)   Exercise Vital Sign    Days of Exercise per Week: 3 days    Minutes of Exercise per Session: 100 min  Stress: No Stress Concern Present (03/24/2024)   [employer] of Occupational Health - Occupational Stress Questionnaire    Feeling of Stress : Not at all  Social Connections: Moderately Integrated (03/24/2024)   Social Connection and Isolation Panel    Frequency of Communication with Friends and Family: Three  times a week    Frequency of Social Gatherings with Friends and Family: Once a week    Attends Religious Services: 1 to 4 times per year    Active Member of [employer] or Organizations: No    Attends Engineer, Structural: Not on file    Marital Status: Married  Intimate Partner Violence: Unknown (03/16/2022)   Received from [facility]   HITS    Physically Hurt: Not on file    Insult or Talk Down To: Not on file    Threaten Physical Harm: Not on file    Scream or Curse: Not on file    Physical Exam: Vital signs in last 24 hours: @BP  (!) 140/91   Pulse 87   Temp 97.7 F (36.5 C) (Temporal)   Ht 5' 9 (1.753 m)   Wt 185 lb (83.9 kg)   SpO2 97%   BMI 27.32 kg/m  GEN: NAD EYE: Sclerae anicteric ENT: MMM CV: Non-tachycardic Pulm: CTA b/l GI: Soft, NT/ND NEURO:  Alert & Oriented x 3   Sandor Flatter, DO  Gastroenterology   10/27/2024 2:57 PM

## 2024-10-30 ENCOUNTER — Telehealth: Payer: Self-pay | Admitting: *Deleted

## 2024-10-30 NOTE — Telephone Encounter (Signed)
 No answer follow up call. Left a message.

## 2024-11-01 LAB — SURGICAL PATHOLOGY

## 2024-11-03 ENCOUNTER — Ambulatory Visit: Payer: Self-pay | Admitting: Gastroenterology

## 2024-11-30 ENCOUNTER — Ambulatory Visit: Admitting: Pulmonary Disease

## 2025-01-19 ENCOUNTER — Ambulatory Visit: Admitting: Pulmonary Disease

## 2025-01-19 ENCOUNTER — Encounter: Payer: Self-pay | Admitting: Pulmonary Disease

## 2025-01-19 VITALS — BP 124/86 | HR 81 | Ht 69.0 in | Wt 202.0 lb

## 2025-01-19 DIAGNOSIS — G4733 Obstructive sleep apnea (adult) (pediatric): Secondary | ICD-10-CM

## 2025-01-19 NOTE — Progress Notes (Signed)
 No new CT scan findings              Francisco Carter    [id number]    [birth year]  Primary Care Physician:O'Sullivan, Eleanor, NP  Referring Physician: Daryl Eleanor, NP [address], [address] [place],  [place] [address]  Chief complaint:   Patient with a history of obstructive sleep apnea Compliant with CPAP Machine is dysfunctional  HPI:  Diagnosed with sleep apnea in 2014 Compliant with CPAP Benefiting from CPAP He was here about 2 years ago and we did talk about renewing his machine at the time  He stated he did not hear anything about following up, did not get any new machine  Is a commercial driver, 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 CPAP due to the machine being dysfunctional  The machine became noisy and finally just quit working  He was using CPAP every night, waking up feeling rested Usually gets about 6 to 7 hours of sleep No morning headaches No dryness of his mouth  He was on auto CPAP of 5-15 with heated humidification  History of hypertension not well-controlled Does have occasional allergy symptoms  His weight has remained stable  No family history of sleep apnea Non-smoker    Outpatient Encounter Medications as of 01/19/2025  Medication Sig   amLODipine  (NORVASC ) 5 MG tablet Take 1 tablet (5 mg total) by mouth daily.   No facility-administered encounter medications on file as of 01/19/2025.    Allergies as of 01/19/2025   (No Known Allergies)    Past Medical History:  Diagnosis Date   Arthritis    Chest pain 04/08/2013   Hypertension    Sleep apnea     Past Surgical History:  Procedure Laterality Date   ROTATOR CUFF REPAIR     left    Family History  Problem Relation Age of Onset   Cancer Mother        bone   Hypertension Mother    Heart disease Father    Hypertension Father    Lupus Sister    COPD Brother    Kidney disease Maternal Uncle        ?kidney failure   Colon cancer Neg Hx    Esophageal  cancer Neg Hx    Rectal cancer Neg Hx    Prostate cancer Neg Hx    Stomach cancer Neg Hx     Social History   Socioeconomic History   Marital status: Married    Spouse name: Not on file   Number of children: Not on file   Years of education: Not on file   Highest education level: 12th grade  Occupational History   Not on file  Tobacco Use   Smoking status: Never   Smokeless tobacco: Never  Vaping Use   Vaping status: Never Used  Substance and Sexual Activity   Alcohol use: Yes    Comment: occationally   Drug use: No   Sexual activity: Yes    Partners: Female  Other Topics Concern   Not on file  Social History Narrative   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         Social Drivers of Health   Tobacco Use: Low Risk (01/19/2025)   Patient History    Smoking Tobacco Use: Never    Smokeless Tobacco Use: Never    Passive Exposure: Not  on file  Financial Resource Strain: Low Risk (03/24/2024)   Overall Financial Resource Strain (CARDIA)    Difficulty of Paying Living Expenses: Not very hard  Food Insecurity: No Food Insecurity (03/24/2024)   Hunger Vital Sign    Worried About Running Out of Food in the Last Year: Never true    Ran Out of Food in the Last Year: Never true  Transportation Needs: No Transportation Needs (03/24/2024)   PRAPARE - Administrator, Civil Service (Medical): No    Lack of Transportation (Non-Medical): No  Physical Activity: Sufficiently Active (03/24/2024)   Exercise Vital Sign    Days of Exercise per Week: 3 days    Minutes of Exercise per Session: 100 min  Stress: No Stress Concern Present (03/24/2024)   [employer] of Occupational Health - Occupational Stress Questionnaire    Feeling of Stress : Not at all  Social Connections: Moderately Integrated (03/24/2024)   Social Connection and Isolation Panel    Frequency of Communication with Friends and Family: Three  times a week    Frequency of Social Gatherings with Friends and Family: Once a week    Attends Religious Services: 1 to 4 times per year    Active Member of [employer] or Organizations: No    Attends Engineer, Structural: Not on file    Marital Status: Married  Intimate Partner Violence: Unknown (03/16/2022)   Received from [facility]   HITS    Physically Hurt: Not on file    Insult or Talk Down To: Not on file    Threaten Physical Harm: Not on file    Scream or Curse: Not on file  Depression (PHQ2-9): Low Risk (07/25/2024)   Depression (PHQ2-9)    PHQ-2 Score: 0  Alcohol Screen: Low Risk (03/24/2024)   Alcohol Screen    Last Alcohol Screening Score (AUDIT): 2  Housing: High Risk (03/24/2024)   Housing Stability Vital Sign    Unable to Pay for Housing in the Last Year: Yes    Number of Times Moved in the Last Year: 0    Homeless in the Last Year: No  Utilities: Not on file  Health Literacy: Not on file    Review of Systems  Constitutional:  Negative for fatigue.  Respiratory:  Positive for apnea.   Psychiatric/Behavioral:  Positive for sleep disturbance.     Vitals:   01/19/25 0851  BP: 124/86  Pulse: 81  SpO2: 96%     Physical Exam Constitutional:      Appearance: Normal appearance.  HENT:     Head: Normocephalic.     Nose: Nose normal.     Mouth/Throat:     Mouth: Mucous membranes are moist.     Comments: Mallampati 3, crowded oropharynx Eyes:     General: No scleral icterus.    Pupils: Pupils are equal, round, and reactive to light.  Cardiovascular:     Rate and Rhythm: Normal rate and regular rhythm.     Heart sounds: No murmur heard.    No friction rub.  Pulmonary:     Effort: No respiratory distress.     Breath sounds: No stridor. No wheezing or rhonchi.  Musculoskeletal:     Cervical back: No rigidity.  Neurological:     General: No focal deficit present.     Mental Status: He is alert.  Psychiatric:        Mood and Affect: Mood normal.        04/15/2022  2:00 PM  Results of the Epworth flowsheet  Sitting and reading 2  Watching TV 3  Sitting, inactive in a public place (e.g. a theatre or a meeting) 0  As a passenger in a car for an hour without a break 2  Lying down to rest in the afternoon when circumstances permit 3  Sitting and talking to someone 0  Sitting quietly after a lunch without alcohol 0  In a car, while stopped for a few minutes in traffic 0  Total score 10   Current Epworth score of 4-01/19/2025  Data Reviewed:  Previous sleep study reviewed showing a home sleep study with severe obstructive sleep apnea Had a titration study to CPAP of 11  Compliance download was reviewed showing machine quit working about October 2025 but was having periods where he felt the machine was not working so he had no use  Assessment:  Patient with obstructive sleep apnea, was compliant with CPAP use, benefiting from CPAP use but machine is finally stopped working  He wants to get back to using CPAP because he was benefiting from it  Will place an order for auto CPAP 5-15 with heated humidification with mask of choice  I will see him back in about 30 to 90 days for compliance  Hypertension - Well-controlled  Plan/Recommendations: Will place order to go to [facility] for auto CPAP 5-15 with heated humidification with patient's mask of choice  Encouraged to make sure he follows up with [facility] and with the office to ensure that he does receive a new machine  Follow-up within 30 to 90 days  Encouraged to get back to using CPAP on a nightly basis  Risk of not treating sleep disordered breathing discussed  I personally spent a total of 31 minutes in the care of the patient today including preparing to see the patient, getting/reviewing separately obtained history, performing a medically appropriate exam/evaluation, counseling and educating, documenting clinical information in the EHR, and independently interpreting  results.   Jennet Epley MD [facility] 01/19/2025, 9:13 AM  CC: Daryl Setter, NP

## 2025-01-19 NOTE — Patient Instructions (Signed)
 We will place an order for new machine to go to [facility]  Auto CPAP 5-15 with heated humidification with patient's mask of choice  You need to follow-up within 30 to 90 days to ensure that you are compliant and benefiting from the machine  If you do not hear anything from [facility], make sure you call them to follow-up  Call us  with significant concerns

## 2025-01-26 ENCOUNTER — Ambulatory Visit: Admitting: Family

## 2025-03-20 ENCOUNTER — Ambulatory Visit: Admitting: Pulmonary Disease

## 2025-05-09 ENCOUNTER — Ambulatory Visit: Admitting: Pulmonary Disease
